# Patient Record
Sex: Female | Born: 1945 | Race: Black or African American | Hispanic: No | Smoking: Current some day smoker
Health system: Southern US, Community
[De-identification: ages and names within clinical notes are randomized; demographics above are authoritative.]

## PROBLEM LIST (undated history)

## (undated) DIAGNOSIS — T883XXA Malignant hyperthermia due to anesthesia, initial encounter: Secondary | ICD-10-CM

## (undated) DIAGNOSIS — IMO0001 Reserved for inherently not codable concepts without codable children: Secondary | ICD-10-CM

## (undated) DIAGNOSIS — I10 Essential (primary) hypertension: Secondary | ICD-10-CM

## (undated) DIAGNOSIS — F0781 Postconcussional syndrome: Secondary | ICD-10-CM

## (undated) DIAGNOSIS — M1990 Unspecified osteoarthritis, unspecified site: Secondary | ICD-10-CM

## (undated) DIAGNOSIS — D649 Anemia, unspecified: Secondary | ICD-10-CM

## (undated) DIAGNOSIS — C679 Malignant neoplasm of bladder, unspecified: Secondary | ICD-10-CM

## (undated) DIAGNOSIS — J449 Chronic obstructive pulmonary disease, unspecified: Secondary | ICD-10-CM

## (undated) DIAGNOSIS — F329 Major depressive disorder, single episode, unspecified: Secondary | ICD-10-CM

## (undated) DIAGNOSIS — F4310 Post-traumatic stress disorder, unspecified: Secondary | ICD-10-CM

## (undated) DIAGNOSIS — E785 Hyperlipidemia, unspecified: Secondary | ICD-10-CM

## (undated) DIAGNOSIS — IMO0002 Reserved for concepts with insufficient information to code with codable children: Secondary | ICD-10-CM

## (undated) DIAGNOSIS — F32A Depression, unspecified: Secondary | ICD-10-CM

## (undated) DIAGNOSIS — M797 Fibromyalgia: Secondary | ICD-10-CM

## (undated) DIAGNOSIS — G44009 Cluster headache syndrome, unspecified, not intractable: Secondary | ICD-10-CM

## (undated) HISTORY — DX: Anemia, unspecified: D64.9

## (undated) HISTORY — DX: Hyperlipidemia, unspecified: E78.5

## (undated) HISTORY — DX: Chronic obstructive pulmonary disease, unspecified: J44.9

## (undated) HISTORY — DX: Fibromyalgia: M79.7

## (undated) HISTORY — PX: HERNIA REPAIR: SHX51

## (undated) HISTORY — PX: CYSTECTOMY: SUR359

## (undated) HISTORY — PX: CYST REMOVAL TRUNK: SHX6283

## (undated) HISTORY — DX: Reserved for concepts with insufficient information to code with codable children: IMO0002

## (undated) HISTORY — DX: Reserved for inherently not codable concepts without codable children: IMO0001

## (undated) HISTORY — DX: Essential (primary) hypertension: I10

## (undated) HISTORY — PX: ABDOMINAL HYSTERECTOMY: SHX81

---

## 1978-06-15 DIAGNOSIS — T883XXA Malignant hyperthermia due to anesthesia, initial encounter: Secondary | ICD-10-CM

## 1978-06-15 HISTORY — PX: DILATION AND CURETTAGE OF UTERUS: SHX78

## 1978-06-15 HISTORY — DX: Malignant hyperthermia due to anesthesia, initial encounter: T88.3XXA

## 2004-06-27 ENCOUNTER — Ambulatory Visit: Payer: Self-pay | Admitting: Family Medicine

## 2004-07-17 ENCOUNTER — Ambulatory Visit: Payer: Self-pay | Admitting: Family Medicine

## 2005-04-29 ENCOUNTER — Ambulatory Visit: Payer: Self-pay | Admitting: Family Medicine

## 2006-06-16 ENCOUNTER — Emergency Department (HOSPITAL_COMMUNITY): Admission: EM | Admit: 2006-06-16 | Discharge: 2006-06-16 | Payer: Self-pay | Admitting: Emergency Medicine

## 2007-08-17 ENCOUNTER — Encounter (INDEPENDENT_AMBULATORY_CARE_PROVIDER_SITE_OTHER): Payer: Self-pay | Admitting: Family Medicine

## 2009-03-21 ENCOUNTER — Encounter: Admission: RE | Admit: 2009-03-21 | Discharge: 2009-03-21 | Payer: Self-pay | Admitting: Internal Medicine

## 2010-06-15 DIAGNOSIS — F0781 Postconcussional syndrome: Secondary | ICD-10-CM

## 2010-06-15 HISTORY — DX: Postconcussional syndrome: F07.81

## 2010-09-22 ENCOUNTER — Emergency Department (HOSPITAL_COMMUNITY)
Admission: EM | Admit: 2010-09-22 | Discharge: 2010-09-23 | Disposition: A | Discharge status: Home / Self Care | Payer: Self-pay | Attending: Emergency Medicine | Admitting: Emergency Medicine

## 2010-09-22 ENCOUNTER — Emergency Department (HOSPITAL_COMMUNITY)
Admission: EM | Admit: 2010-09-22 | Discharge: 2010-09-22 | Discharge status: Against Medical Advice | Payer: Self-pay | Attending: Emergency Medicine | Admitting: Emergency Medicine

## 2010-09-22 DIAGNOSIS — R51 Headache: Secondary | ICD-10-CM | POA: Insufficient documentation

## 2010-09-22 DIAGNOSIS — H538 Other visual disturbances: Secondary | ICD-10-CM | POA: Insufficient documentation

## 2010-09-22 DIAGNOSIS — S0990XA Unspecified injury of head, initial encounter: Secondary | ICD-10-CM | POA: Insufficient documentation

## 2010-09-22 DIAGNOSIS — S060X0A Concussion without loss of consciousness, initial encounter: Secondary | ICD-10-CM | POA: Insufficient documentation

## 2010-09-22 DIAGNOSIS — IMO0002 Reserved for concepts with insufficient information to code with codable children: Secondary | ICD-10-CM | POA: Insufficient documentation

## 2010-09-22 DIAGNOSIS — Y929 Unspecified place or not applicable: Secondary | ICD-10-CM | POA: Insufficient documentation

## 2010-09-22 DIAGNOSIS — R404 Transient alteration of awareness: Secondary | ICD-10-CM | POA: Insufficient documentation

## 2010-09-23 ENCOUNTER — Emergency Department (HOSPITAL_COMMUNITY): Payer: Self-pay

## 2010-12-14 ENCOUNTER — Emergency Department (HOSPITAL_COMMUNITY): Payer: Self-pay

## 2010-12-14 ENCOUNTER — Emergency Department (HOSPITAL_COMMUNITY)
Admission: EM | Admit: 2010-12-14 | Discharge: 2010-12-14 | Disposition: A | Discharge status: Home / Self Care | Payer: Self-pay | Attending: Emergency Medicine | Admitting: Emergency Medicine

## 2010-12-14 DIAGNOSIS — IMO0002 Reserved for concepts with insufficient information to code with codable children: Secondary | ICD-10-CM | POA: Insufficient documentation

## 2010-12-14 DIAGNOSIS — Y92009 Unspecified place in unspecified non-institutional (private) residence as the place of occurrence of the external cause: Secondary | ICD-10-CM | POA: Insufficient documentation

## 2010-12-14 DIAGNOSIS — F0781 Postconcussional syndrome: Secondary | ICD-10-CM | POA: Insufficient documentation

## 2010-12-14 DIAGNOSIS — R51 Headache: Secondary | ICD-10-CM | POA: Insufficient documentation

## 2010-12-14 DIAGNOSIS — S92919A Unspecified fracture of unspecified toe(s), initial encounter for closed fracture: Secondary | ICD-10-CM | POA: Insufficient documentation

## 2011-05-19 ENCOUNTER — Encounter: Payer: Self-pay | Admitting: *Deleted

## 2011-05-19 DIAGNOSIS — R51 Headache: Secondary | ICD-10-CM | POA: Insufficient documentation

## 2011-05-19 DIAGNOSIS — F17200 Nicotine dependence, unspecified, uncomplicated: Secondary | ICD-10-CM | POA: Insufficient documentation
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
## 2011-05-20 ENCOUNTER — Other Ambulatory Visit (HOSPITAL_COMMUNITY): Payer: Self-pay | Admitting: Family Medicine

## 2011-05-20 ENCOUNTER — Emergency Department (HOSPITAL_COMMUNITY)
Admission: EM | Admit: 2011-05-20 | Discharge: 2011-05-20 | Disposition: A | Discharge status: Home / Self Care | Payer: Self-pay | Attending: Emergency Medicine | Admitting: Emergency Medicine

## 2011-05-20 ENCOUNTER — Encounter (HOSPITAL_COMMUNITY): Payer: Self-pay | Admitting: *Deleted

## 2011-05-20 ENCOUNTER — Emergency Department (HOSPITAL_COMMUNITY): Payer: Self-pay

## 2011-05-20 DIAGNOSIS — G44009 Cluster headache syndrome, unspecified, not intractable: Secondary | ICD-10-CM

## 2011-05-20 DIAGNOSIS — R51 Headache: Secondary | ICD-10-CM

## 2011-05-20 HISTORY — DX: Post-traumatic stress disorder, unspecified: F43.10

## 2011-05-20 HISTORY — DX: Cluster headache syndrome, unspecified, not intractable: G44.009

## 2011-05-20 HISTORY — DX: Depression, unspecified: F32.A

## 2011-05-20 HISTORY — DX: Major depressive disorder, single episode, unspecified: F32.9

## 2011-05-20 HISTORY — DX: Postconcussional syndrome: F07.81

## 2011-05-20 NOTE — ED Notes (Signed)
Pt states that she has headaches that start at left temple of head and radiates to side of head and and causes her left eye close shut and waters up. Pt states that she has had migraines in the past and that these headaches are not the same. Pt states that she was supposed to follow up with a Neurologist. Pt states that she does not have insurance and cannot afford to see a Neurologist or PCP. Pt states that she went to see a PCP at [facility] and was diagnosed with cluster headaches and given prednisone tapered dose. States that prednisone helped but headache came back when she ran out of prednisone.

## 2011-05-20 NOTE — ED Notes (Signed)
No complaints at present. Voices understanding of instructions given. Walked to check out window.  

## 2011-05-20 NOTE — ED Notes (Signed)
Patient ambulated to bathroom to collect specimen.

## 2011-05-20 NOTE — ED Notes (Signed)
Pt is requesting to have an MRI done because she has had 5 deaths in her family from cerebral aneuryisms
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
## 2011-05-26 ENCOUNTER — Other Ambulatory Visit (HOSPITAL_COMMUNITY): Payer: Self-pay

## 2011-12-13 ENCOUNTER — Emergency Department (HOSPITAL_COMMUNITY): Payer: No Typology Code available for payment source

## 2011-12-13 ENCOUNTER — Encounter (HOSPITAL_COMMUNITY): Payer: Self-pay | Admitting: Emergency Medicine

## 2011-12-13 ENCOUNTER — Emergency Department (HOSPITAL_COMMUNITY)
Admission: EM | Admit: 2011-12-13 | Discharge: 2011-12-13 | Disposition: A | Discharge status: Home / Self Care | Payer: No Typology Code available for payment source | Attending: Emergency Medicine | Admitting: Emergency Medicine

## 2011-12-13 DIAGNOSIS — F17200 Nicotine dependence, unspecified, uncomplicated: Secondary | ICD-10-CM | POA: Insufficient documentation

## 2011-12-13 DIAGNOSIS — R51 Headache: Secondary | ICD-10-CM | POA: Insufficient documentation

## 2011-12-13 DIAGNOSIS — M542 Cervicalgia: Secondary | ICD-10-CM | POA: Insufficient documentation

## 2011-12-13 DIAGNOSIS — Z79899 Other long term (current) drug therapy: Secondary | ICD-10-CM | POA: Insufficient documentation

## 2011-12-13 MED ORDER — OXYCODONE-ACETAMINOPHEN 5-325 MG PO TABS: 1.0000 | ORAL_TABLET | Freq: Four times a day (QID) | ORAL | Status: AC | PRN

## 2011-12-13 MED ORDER — IBUPROFEN 800 MG PO TABS
800.0000 mg | ORAL_TABLET | Freq: Once | ORAL | Status: AC
  Administered 2011-12-13: 800 mg via ORAL
  Filled 2011-12-13: qty 1
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
## 2011-12-13 NOTE — ED Notes (Signed)
Patient transported to CT 

## 2011-12-13 NOTE — ED Notes (Signed)
Return from CT

## 2011-12-13 NOTE — ED Notes (Signed)
MD at bedside. 
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
## 2012-04-29 ENCOUNTER — Other Ambulatory Visit (HOSPITAL_COMMUNITY): Payer: Self-pay | Admitting: Internal Medicine

## 2012-04-29 DIAGNOSIS — F17200 Nicotine dependence, unspecified, uncomplicated: Secondary | ICD-10-CM

## 2012-04-29 DIAGNOSIS — Z1231 Encounter for screening mammogram for malignant neoplasm of breast: Secondary | ICD-10-CM

## 2012-05-23 ENCOUNTER — Ambulatory Visit (HOSPITAL_COMMUNITY)
Admission: RE | Admit: 2012-05-23 | Discharge: 2012-05-23 | Disposition: A | Discharge status: Home / Self Care | Payer: Medicare Other | Source: Ambulatory Visit | Attending: Internal Medicine | Admitting: Internal Medicine

## 2012-05-23 DIAGNOSIS — I10 Essential (primary) hypertension: Secondary | ICD-10-CM | POA: Insufficient documentation

## 2012-05-23 DIAGNOSIS — Z0000 Encounter for general adult medical examination without abnormal findings: Secondary | ICD-10-CM | POA: Insufficient documentation

## 2012-05-23 DIAGNOSIS — F17200 Nicotine dependence, unspecified, uncomplicated: Secondary | ICD-10-CM | POA: Insufficient documentation

## 2012-05-23 DIAGNOSIS — Z1231 Encounter for screening mammogram for malignant neoplasm of breast: Secondary | ICD-10-CM | POA: Insufficient documentation

## 2012-09-14 ENCOUNTER — Other Ambulatory Visit: Payer: Self-pay | Admitting: Internal Medicine

## 2012-09-14 DIAGNOSIS — M5410 Radiculopathy, site unspecified: Secondary | ICD-10-CM

## 2012-09-17 ENCOUNTER — Other Ambulatory Visit: Payer: Medicare Other

## 2012-09-19 ENCOUNTER — Other Ambulatory Visit: Payer: Medicare Other

## 2012-09-23 ENCOUNTER — Ambulatory Visit
Admission: RE | Admit: 2012-09-23 | Discharge: 2012-09-23 | Disposition: A | Discharge status: Home / Self Care | Payer: Medicare Other | Source: Ambulatory Visit | Attending: Internal Medicine | Admitting: Internal Medicine

## 2012-09-23 DIAGNOSIS — M5410 Radiculopathy, site unspecified: Secondary | ICD-10-CM

## 2013-06-16 ENCOUNTER — Ambulatory Visit (INDEPENDENT_AMBULATORY_CARE_PROVIDER_SITE_OTHER): Payer: Medicare Other | Admitting: Physician Assistant

## 2013-06-16 ENCOUNTER — Encounter: Payer: Self-pay | Admitting: Physician Assistant

## 2013-06-16 VITALS — BP 122/72 | HR 80 | Temp 98.2°F | Resp 16 | Ht 68.0 in | Wt 133.0 lb

## 2013-06-16 DIAGNOSIS — J019 Acute sinusitis, unspecified: Secondary | ICD-10-CM

## 2013-06-16 MED ORDER — AZITHROMYCIN 250 MG PO TABS
ORAL_TABLET | ORAL | Status: DC
Start: 2013-06-16 — End: 2013-07-11

## 2013-06-16 MED ORDER — PREDNISONE 20 MG PO TABS
ORAL_TABLET | ORAL | Status: DC
Start: 2013-06-16 — End: 2013-07-11

## 2013-06-16 NOTE — Patient Instructions (Addendum)
The majority of colds are caused by viruses and do not require antibiotics. Please read the rest of this hand out to learn more about the common cold and what you can do to help yourself as well as help prevent the over use of antibiotics.   COMMON COLD SIGNS AND SYMPTOMS - The common cold usually causes nasal congestion, runny nose, and sneezing. A sore throat may be present on the first day but usually resolves quickly. If a cough occurs, it generally develops on about the fourth or fifth day of symptoms, typically when congestion and runny nose are resolving  COMMON COLD COMPLICATIONS - In most cases, colds do not cause serious illness or complications. Most colds last for three to seven days, although many people continue to have symptoms (coughing, sneezing, congestion) for up to two weeks.  One of the more common complications is sinusitis, which is usually caused by viruses and rarely (about 2 percent of the time) by bacteria. Having thick or yellow to green-colored nasal discharge does not mean that bacterial sinusitis has developed; discolored nasal discharge is a normal phase of the common cold.  Lower respiratory infections, such as pneumonia or bronchitis, may develop following a cold.  Infection of the middle ear, or otitis media, can accompany or follow a cold.  COMMON COLD TREATMENT - There is no specific treatment for the viruses that cause the common cold. Most treatments are aimed at relieving some of the symptoms of the cold, but do not shorten or cure the cold. Antibiotics are not useful for treating the common cold; antibiotics are only used to treat illnesses caused by bacteria, not viruses. Unnecessary use of antibiotics for the treatment of the common cold can cause allergic reactions, diarrhea, or other gastrointestinal symptoms in some patients.  The symptoms of a cold will resolve over time, even without any treatment. People with underlying medical conditions and those who use  other over-the-counter or prescription medications should speak with their healthcare provider or pharmacist to ensure that it is safe to use these treatments. The following are treatments that may reduce the symptoms caused by the common cold.  Nasal congestion - Decongestants are good for nasal congestion- if you feel very stuffy but no mucus is coming out, this is the medication that will help you the most.  Pseudoephedrine is a decongestant that can improve nasal congestion. Although a prescription is not required, drugstores in the [place] keep pseudoephedrine behind the counter, so it must be requested from a pharmacist. If you have a heart condition or high blood pressure please use Coricidin BPH instead.   Runny nose - Antihistamines such as diphenhydramine (Benadryl), certazine (Zyrtec) which are best taking at night because they can make you tired OR loratadine (Claritin),  fexafinadine (Allegra) help with a runny nose.   Nasal sprays such an oxymetazoline (Afrin and others) may also give temporary relief of nasal congestion. However, these sprays should never be used for more than two to three days; use for more than three days use can worsen congestion.  Nasocort is now over the counter and can help decrease a runny nose. Please stop the medication if you have blurry vision or nose bleeds.   Sore throat and headache - Sore throat and headache are best treated with a mild pain reliever such as acetaminophen (Tylenol) or a non-steroidal anti-inflammatory agent such as ibuprofen or naproxen (Motrin or Aleve). These medications should be taken with food to prevent stomach problems. As well as   gargling with warm water and salt.   Cough - Common cough medicine ingredients include guaifenesin and dextromethorphan; these are often combined with other medications in over-the-counter cold formulas. Often a cough is worse at night or first in the morning due to post nasal drip from you nose. You can  try to sleep at an angle to decrease a cough.   Alternative treatments - Heated, humidified air can improve symptoms of nasal congestion and runny nose, and causes few to no side effects. A number of alternative products, including vitamin C, doubling up on your vitamin D and herbal products such as echinacea, may help. Certain products, such as nasal gels that contain zinc (eg, Zicam), have been associated with a permanent loss of smell.  Antibiotics - Antibiotics should not be used to treat an uncomplicated common cold. As noted above, colds are caused by viruses. Antibiotics treat bacterial, not viral infections. Some viruses that cause the common cold can also depress the immune system or cause swelling in the lining of the nose or airways; this can, in turn, lead to a bacterial infection. Often you need to give your body 7 days to fight off a common cold while treating the symptoms with the medications listed above. If after 7 days your symptoms are not improving, you are getting worse, you have shortness of breath, chest pain, a fever of over 103 you should seek medical help immediately.   PREVENTION IS THE BEST MEDICINE - Hand washing is an essential and highly effective way to prevent the spread of infection.  Alcohol-based hand rubs are a good alternative for disinfecting hands if a sink is not available.  Hands should be washed before preparing food and eating and after coughing, blowing the nose, or sneezing. While it is not always possible to limit contact with people who may be infected with a cold, touching the eyes, nose, or mouth after direct contact should be avoided when possible. Sneezing/coughing into the sleeve of one's clothing (at the inner elbow) is another means of containing sprays of saliva and secretions and does not contaminate the hands.  Take the prednsone 20mg  once daily in the morning with food for 2-3 days and then if you are not feeling better take the Zpak given.

## 2013-06-16 NOTE — Progress Notes (Signed)
   Subjective:    Patient ID: Sonya Banks, female    DOB: 04-Mar-1946, 68 y.o.   MRN: [id number]  Sinus Problem This is a new problem. Episode onset: 4 days. The problem has been gradually worsening since onset. There has been no fever. She is experiencing no pain. Associated symptoms include chills, congestion, coughing, headaches, sinus pressure, sneezing and a sore throat. Pertinent negatives include no diaphoresis, ear pain, hoarse voice, neck pain, shortness of breath or swollen glands. Past treatments include acetaminophen and oral decongestants. The treatment provided no relief.   Review of Systems  Constitutional: Positive for chills. Negative for fever and diaphoresis.  HENT: Positive for congestion, sinus pressure, sneezing and sore throat. Negative for ear pain, hoarse voice, tinnitus, trouble swallowing and voice change.   Eyes: Negative.   Respiratory: Positive for cough. Negative for chest tightness, shortness of breath and wheezing.   Cardiovascular: Negative.   Gastrointestinal: Negative.   Endocrine: Negative.   Genitourinary: Negative.   Musculoskeletal: Negative.  Negative for neck pain.  Neurological: Positive for headaches. Negative for dizziness, light-headedness and numbness.      Objective:   Physical Exam  Constitutional: She appears well-developed and well-nourished.  HENT:  Head: Normocephalic and atraumatic.  Right Ear: External ear normal.  Nose: Right sinus exhibits frontal sinus tenderness. Left sinus exhibits frontal sinus tenderness.  Eyes: Conjunctivae and EOM are normal.  Neck: Normal range of motion. Neck supple.  Cardiovascular: Normal rate, regular rhythm, normal heart sounds and intact distal pulses.   Pulmonary/Chest: Effort normal and breath sounds normal. No respiratory distress. She has no wheezes.  Abdominal: Soft. Bowel sounds are normal.  Lymphadenopathy:    She has no cervical adenopathy.  Skin: Skin is warm and dry.       Assessment & Plan:  Acute sinusitis, unspecified - Plan: azithromycin (ZITHROMAX) 250 MG tablet, predniSONE (DELTASONE) 20 MG tablet  Hold onto Zpak and if she is not feeling better in 5 days then get on it.

## 2013-07-10 DIAGNOSIS — I10 Essential (primary) hypertension: Secondary | ICD-10-CM | POA: Insufficient documentation

## 2013-07-10 DIAGNOSIS — D649 Anemia, unspecified: Secondary | ICD-10-CM | POA: Insufficient documentation

## 2013-07-10 DIAGNOSIS — G441 Vascular headache, not elsewhere classified: Secondary | ICD-10-CM | POA: Insufficient documentation

## 2013-07-10 DIAGNOSIS — M797 Fibromyalgia: Secondary | ICD-10-CM | POA: Insufficient documentation

## 2013-07-10 DIAGNOSIS — E785 Hyperlipidemia, unspecified: Secondary | ICD-10-CM | POA: Insufficient documentation

## 2013-07-10 DIAGNOSIS — F325 Major depressive disorder, single episode, in full remission: Secondary | ICD-10-CM | POA: Insufficient documentation

## 2013-07-10 DIAGNOSIS — J449 Chronic obstructive pulmonary disease, unspecified: Secondary | ICD-10-CM | POA: Insufficient documentation

## 2013-07-11 ENCOUNTER — Encounter: Payer: Self-pay | Admitting: Internal Medicine

## 2013-07-11 ENCOUNTER — Ambulatory Visit (INDEPENDENT_AMBULATORY_CARE_PROVIDER_SITE_OTHER): Payer: Medicare Other | Admitting: Internal Medicine

## 2013-07-11 VITALS — BP 124/72 | HR 68 | Temp 97.9°F | Resp 16 | Ht 68.0 in | Wt 130.8 lb

## 2013-07-11 DIAGNOSIS — E785 Hyperlipidemia, unspecified: Secondary | ICD-10-CM

## 2013-07-11 DIAGNOSIS — R7303 Prediabetes: Secondary | ICD-10-CM | POA: Insufficient documentation

## 2013-07-11 DIAGNOSIS — Z0000 Encounter for general adult medical examination without abnormal findings: Secondary | ICD-10-CM

## 2013-07-11 DIAGNOSIS — E559 Vitamin D deficiency, unspecified: Secondary | ICD-10-CM

## 2013-07-11 DIAGNOSIS — N6019 Diffuse cystic mastopathy of unspecified breast: Secondary | ICD-10-CM

## 2013-07-11 DIAGNOSIS — Z1212 Encounter for screening for malignant neoplasm of rectum: Secondary | ICD-10-CM

## 2013-07-11 DIAGNOSIS — R7309 Other abnormal glucose: Secondary | ICD-10-CM

## 2013-07-11 DIAGNOSIS — Z79899 Other long term (current) drug therapy: Secondary | ICD-10-CM

## 2013-07-11 DIAGNOSIS — I10 Essential (primary) hypertension: Secondary | ICD-10-CM

## 2013-07-11 NOTE — Patient Instructions (Signed)
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
## 2013-07-12 LAB — CBC WITH DIFFERENTIAL/PLATELET
BASOS ABS: 0 10*3/uL (ref 0.0–0.1)
Basophils Relative: 0 % (ref 0–1)
Eosinophils Absolute: 0.1 10*3/uL (ref 0.0–0.7)
Eosinophils Relative: 2 % (ref 0–5)
HEMATOCRIT: 39.2 % (ref 36.0–46.0)
HEMOGLOBIN: 13 g/dL (ref 12.0–15.0)
LYMPHS PCT: 48 % — AB (ref 12–46)
Lymphs Abs: 2.3 10*3/uL (ref 0.7–4.0)
MCH: 29.6 pg (ref 26.0–34.0)
MCHC: 33.2 g/dL (ref 30.0–36.0)
MCV: 89.3 fL (ref 78.0–100.0)
MONO ABS: 0.4 10*3/uL (ref 0.1–1.0)
Monocytes Relative: 9 % (ref 3–12)
NEUTROS ABS: 2 10*3/uL (ref 1.7–7.7)
NEUTROS PCT: 41 % — AB (ref 43–77)
PLATELETS: 278 10*3/uL (ref 150–400)
RBC: 4.39 MIL/uL (ref 3.87–5.11)
RDW: 12.8 % (ref 11.5–15.5)
WBC: 4.8 10*3/uL (ref 4.0–10.5)

## 2013-07-12 LAB — HEPATIC FUNCTION PANEL
ALK PHOS: 84 U/L (ref 39–117)
ALT: 11 U/L (ref 0–35)
AST: 21 U/L (ref 0–37)
Albumin: 4 g/dL (ref 3.5–5.2)
BILIRUBIN DIRECT: 0.1 mg/dL (ref 0.0–0.3)
BILIRUBIN INDIRECT: 0.3 mg/dL (ref 0.0–0.9)
BILIRUBIN TOTAL: 0.4 mg/dL (ref 0.3–1.2)
Total Protein: 6.9 g/dL (ref 6.0–8.3)

## 2013-07-12 LAB — TSH: TSH: 1.167 u[IU]/mL (ref 0.350–4.500)

## 2013-07-12 LAB — INSULIN, FASTING: Insulin fasting, serum: 28 u[IU]/mL (ref 3–28)

## 2013-07-12 LAB — LIPID PANEL
CHOLESTEROL: 173 mg/dL (ref 0–200)
HDL: 46 mg/dL (ref >39)
LDL Cholesterol: 112 mg/dL — ABNORMAL HIGH (ref 0–99)
TRIGLYCERIDES: 77 mg/dL (ref <150)
Total CHOL/HDL Ratio: 3.8 Ratio
VLDL: 15 mg/dL (ref 0–40)

## 2013-07-12 LAB — BASIC METABOLIC PANEL WITH GFR
BUN: 11 mg/dL (ref 6–23)
CO2: 30 mEq/L (ref 19–32)
Calcium: 10.1 mg/dL (ref 8.4–10.5)
Chloride: 108 mEq/L (ref 96–112)
Creat: 0.77 mg/dL (ref 0.50–1.10)
GFR, Est Non African American: 81 mL/min
GLUCOSE: 89 mg/dL (ref 70–99)
POTASSIUM: 5.8 meq/L — AB (ref 3.5–5.3)
Sodium: 142 mEq/L (ref 135–145)

## 2013-07-12 LAB — URINALYSIS, MICROSCOPIC ONLY
BACTERIA UA: NONE SEEN
Casts: NONE SEEN
Crystals: NONE SEEN
Squamous Epithelial / LPF: NONE SEEN

## 2013-07-12 LAB — MICROALBUMIN / CREATININE URINE RATIO
CREATININE, URINE: 100.5 mg/dL
Microalb Creat Ratio: 54.2 mg/g — ABNORMAL HIGH (ref 0.0–30.0)
Microalb, Ur: 5.45 mg/dL — ABNORMAL HIGH (ref 0.00–1.89)

## 2013-07-12 LAB — HEMOGLOBIN A1C
Hgb A1c MFr Bld: 5.6 % (ref <5.7)
MEAN PLASMA GLUCOSE: 114 mg/dL (ref <117)

## 2013-07-12 LAB — VITAMIN D 25 HYDROXY (VIT D DEFICIENCY, FRACTURES): Vit D, 25-Hydroxy: 52 ng/mL (ref 30–89)

## 2013-07-12 LAB — MAGNESIUM: MAGNESIUM: 2 mg/dL (ref 1.5–2.5)

## 2013-07-17 ENCOUNTER — Other Ambulatory Visit: Payer: Self-pay | Admitting: Internal Medicine

## 2013-07-17 ENCOUNTER — Telehealth: Payer: Self-pay | Admitting: *Deleted

## 2013-07-17 MED ORDER — MELOXICAM 7.5 MG PO TABS: 7.5000 mg | ORAL_TABLET | Freq: Every day | ORAL | Status: DC

## 2013-07-18 ENCOUNTER — Other Ambulatory Visit: Payer: Self-pay | Admitting: Internal Medicine

## 2013-07-18 ENCOUNTER — Other Ambulatory Visit: Payer: Medicare Other

## 2013-07-18 DIAGNOSIS — N3000 Acute cystitis without hematuria: Secondary | ICD-10-CM

## 2013-07-19 LAB — URINE CULTURE
COLONY COUNT: NO GROWTH
ORGANISM ID, BACTERIA: NO GROWTH

## 2013-07-19 NOTE — Telephone Encounter (Signed)
Spoke with patient about labs 

## 2013-07-28 ENCOUNTER — Other Ambulatory Visit: Payer: Self-pay | Admitting: Internal Medicine

## 2013-07-28 MED ORDER — FLUCONAZOLE 150 MG PO TABS: ORAL_TABLET | ORAL | Status: AC

## 2013-08-01 ENCOUNTER — Other Ambulatory Visit: Payer: Self-pay | Admitting: Internal Medicine

## 2013-08-01 DIAGNOSIS — Z1231 Encounter for screening mammogram for malignant neoplasm of breast: Secondary | ICD-10-CM

## 2013-08-03 ENCOUNTER — Other Ambulatory Visit (INDEPENDENT_AMBULATORY_CARE_PROVIDER_SITE_OTHER): Payer: Medicare Other

## 2013-08-03 DIAGNOSIS — Z1212 Encounter for screening for malignant neoplasm of rectum: Secondary | ICD-10-CM

## 2013-08-03 LAB — POC HEMOCCULT BLD/STL (HOME/3-CARD/SCREEN)
Card #3 Fecal Occult Blood, POC: NEGATIVE
FECAL OCCULT BLD: NEGATIVE
FECAL OCCULT BLD: NEGATIVE

## 2013-08-09 ENCOUNTER — Ambulatory Visit (HOSPITAL_COMMUNITY)
Admission: RE | Admit: 2013-08-09 | Discharge: 2013-08-09 | Disposition: A | Discharge status: Home / Self Care | Payer: Medicare Other | Source: Ambulatory Visit | Attending: Internal Medicine | Admitting: Internal Medicine

## 2013-08-09 DIAGNOSIS — Z1231 Encounter for screening mammogram for malignant neoplasm of breast: Secondary | ICD-10-CM

## 2013-10-24 ENCOUNTER — Other Ambulatory Visit: Payer: Self-pay | Admitting: *Deleted

## 2013-10-24 DIAGNOSIS — R319 Hematuria, unspecified: Secondary | ICD-10-CM

## 2013-10-31 ENCOUNTER — Telehealth: Payer: Self-pay

## 2013-10-31 NOTE — Telephone Encounter (Signed)
Pt called to check status of urology appt. I called [facility] to see if pt had been scheduled. Pt is scheduled 11-09-13 @ 1:45 w/ Dr.Eskridge. Pt is extremely upset because she has waited 1 week for appt and is requesting to speak w/ Dr.McKeown. Please call pt.

## 2013-11-13 DIAGNOSIS — C679 Malignant neoplasm of bladder, unspecified: Secondary | ICD-10-CM

## 2013-11-13 HISTORY — DX: Malignant neoplasm of bladder, unspecified: C67.9

## 2013-11-20 ENCOUNTER — Other Ambulatory Visit: Payer: Self-pay | Admitting: Urology

## 2013-11-21 ENCOUNTER — Ambulatory Visit (INDEPENDENT_AMBULATORY_CARE_PROVIDER_SITE_OTHER): Payer: Medicare Other | Admitting: Internal Medicine

## 2013-11-21 ENCOUNTER — Encounter: Payer: Self-pay | Admitting: Internal Medicine

## 2013-11-21 VITALS — BP 100/64 | HR 84 | Temp 98.2°F | Resp 16 | Ht 68.0 in | Wt 131.2 lb

## 2013-11-21 DIAGNOSIS — F17200 Nicotine dependence, unspecified, uncomplicated: Secondary | ICD-10-CM

## 2013-11-21 DIAGNOSIS — F341 Dysthymic disorder: Secondary | ICD-10-CM

## 2013-11-21 DIAGNOSIS — I10 Essential (primary) hypertension: Secondary | ICD-10-CM

## 2013-11-21 DIAGNOSIS — C679 Malignant neoplasm of bladder, unspecified: Secondary | ICD-10-CM

## 2013-11-21 MED ORDER — BUPROPION HCL ER (XL) 150 MG PO TB24: 150.0000 mg | ORAL_TABLET | ORAL | Status: DC

## 2013-11-21 MED ORDER — LORAZEPAM 2 MG PO TABS: ORAL_TABLET | ORAL | Status: DC

## 2013-11-21 MED ORDER — BUPROPION HCL ER (XL) 300 MG PO TB24: 300.0000 mg | ORAL_TABLET | ORAL | Status: DC

## 2013-11-21 NOTE — Patient Instructions (Signed)
Smoking Cessation, Tips for Success If you are ready to quit smoking, congratulations! You have chosen to help yourself be healthier. Cigarettes bring nicotine, tar, carbon monoxide, and other irritants into your body. Your lungs, heart, and blood vessels will be able to work better without these poisons. There are many different ways to quit smoking. Nicotine gum, nicotine patches, a nicotine inhaler, or nicotine nasal spray can help with physical craving. Hypnosis, support groups, and medicines help break the habit of smoking. WHAT THINGS CAN I DO TO MAKE QUITTING EASIER?  Here are some tips to help you quit for good:  Pick a date when you will quit smoking completely. Tell all of your friends and family about your plan to quit on that date.  Do not try to slowly cut down on the number of cigarettes you are smoking. Pick a quit date and quit smoking completely starting on that day.  Throw away all cigarettes.   Clean and remove all ashtrays from your home, work, and car.   On a card, write down your reasons for quitting. Carry the card with you and read it when you get the urge to smoke.   Cleanse your body of nicotine. Drink enough water and fluids to keep your urine clear or pale yellow. Do this after quitting to flush the nicotine from your body.   Learn to predict your moods. Do not let a bad situation be your excuse to have a cigarette. Some situations in your life might tempt you into wanting a cigarette.   Never have "just one" cigarette. It leads to wanting another and another. Remind yourself of your decision to quit.   Change habits associated with smoking. If you smoked while driving or when feeling stressed, try other activities to replace smoking. Stand up when drinking your coffee. Brush your teeth after eating. Sit in a different chair when you read the paper. Avoid alcohol while trying to quit, and try to drink fewer caffeinated beverages. Alcohol and caffeine may urge  you to smoke.   Avoid foods and drinks that can trigger a desire to smoke, such as sugary or spicy foods and alcohol.   Ask people who smoke not to smoke around you.   Have something planned to do right after eating or having a cup of coffee. For example, plan to take a walk or exercise.   Try a relaxation exercise to calm you down and decrease your stress. Remember, you may be tense and nervous for the first 2 weeks after you quit, but this will pass.   Find new activities to keep your hands busy. Play with a pen, coin, or rubber band. Doodle or draw things on paper.   Brush your teeth right after eating. This will help cut down on the craving for the taste of tobacco after meals. You can also try mouthwash.   Use oral substitutes in place of cigarettes. Try using lemon drops, carrots, cinnamon sticks, or chewing gum. Keep them handy so they are available when you have the urge to smoke.   When you have the urge to smoke, try deep breathing.   Designate your home as a nonsmoking area.   If you are a heavy smoker, ask your health care provider about a prescription for nicotine chewing gum. It can ease your withdrawal from nicotine.   Reward yourself. Set aside the cigarette money you save and buy yourself something nice.   Look for support from others. Join a support group or  smoking cessation program. Ask someone at home or at work to help you with your plan to quit smoking.   Always ask yourself, "Do I need this cigarette or is this just a reflex?" Tell yourself, "Today, I choose not to smoke," or "I do not want to smoke." You are reminding yourself of your decision to quit.  Do not replace cigarette smoking with electronic cigarettes (commonly called e-cigarettes). The safety of e-cigarettes is unknown, and some may contain harmful chemicals.  If you relapse, do not give up! Plan ahead and think about what you will do the next time you get the urge to smoke.  HOW WILL  I FEEL WHEN I QUIT SMOKING? You may have symptoms of withdrawal because your body is used to nicotine (the addictive substance in cigarettes). You may crave cigarettes, be irritable, feel very hungry, cough often, get headaches, or have difficulty concentrating. The withdrawal symptoms are only temporary. They are strongest when you first quit but will go away within 10 14 days. When withdrawal symptoms occur, stay in control. Think about your reasons for quitting. Remind yourself that these are signs that your body is healing and getting used to being without cigarettes. Remember that withdrawal symptoms are easier to treat than the major diseases that smoking can cause.  Even after the withdrawal is over, expect periodic urges to smoke. However, these cravings are generally short lived and will go away whether you smoke or not. Do not smoke!  WHAT RESOURCES ARE AVAILABLE TO HELP ME QUIT SMOKING? Your health care provider can direct you to community resources or hospitals for support, which may include:  Group support.  Education.  Hypnosis.  Therapy. Document Released: 02/28/2004 Document Revised: 03/22/2013 Document Reviewed: 11/17/2012 [facility] Patient Information 2014 [place], [place].    Smoking Cessation Quitting smoking is important to your health and has many advantages. However, it is not always easy to quit since nicotine is a very addictive drug. Often times, people try 3 times or more before being able to quit. This document explains the best ways for you to prepare to quit smoking. Quitting takes hard work and a lot of effort, but you can do it. ADVANTAGES OF QUITTING SMOKING  You will live longer, feel better, and live better.  Your body will feel the impact of quitting smoking almost immediately.  Within 20 minutes, blood pressure decreases. Your pulse returns to its normal level.  After 8 hours, carbon monoxide levels in the blood return to normal. Your oxygen level  increases.  After 24 hours, the chance of having a heart attack starts to decrease. Your breath, hair, and body stop smelling like smoke.  After 48 hours, damaged nerve endings begin to recover. Your sense of taste and smell improve.  After 72 hours, the body is virtually free of nicotine. Your bronchial tubes relax and breathing becomes easier.  After 2 to 12 weeks, lungs can hold more air. Exercise becomes easier and circulation improves.  The risk of having a heart attack, stroke, cancer, or lung disease is greatly reduced.  After 1 year, the risk of coronary heart disease is cut in half.  After 5 years, the risk of stroke falls to the same as a nonsmoker.  After 10 years, the risk of lung cancer is cut in half and the risk of other cancers decreases significantly.  After 15 years, the risk of coronary heart disease drops, usually to the level of a nonsmoker.  If you are pregnant, quitting smoking  will improve your chances of having a healthy baby.  The people you live with, especially any children, will be healthier.  You will have extra money to spend on things other than cigarettes. QUESTIONS TO THINK ABOUT BEFORE ATTEMPTING TO QUIT You may want to talk about your answers with your caregiver.  Why do you want to quit?  If you tried to quit in the past, what helped and what did not?  What will be the most difficult situations for you after you quit? How will you plan to handle them?  Who can help you through the tough times? Your family? Friends? A caregiver?  What pleasures do you get from smoking? What ways can you still get pleasure if you quit? Here are some questions to ask your caregiver:  How can you help me to be successful at quitting?  What medicine do you think would be best for me and how should I take it?  What should I do if I need more help?  What is smoking withdrawal like? How can I get information on withdrawal? GET READY  Set a quit  date.  Change your environment by getting rid of all cigarettes, ashtrays, matches, and lighters in your home, car, or work. Do not let people smoke in your home.  Review your past attempts to quit. Think about what worked and what did not. GET SUPPORT AND ENCOURAGEMENT You have a better chance of being successful if you have help. You can get support in many ways.  Tell your family, friends, and co-workers that you are going to quit and need their support. Ask them not to smoke around you.  Get individual, group, or telephone counseling and support. Programs are available at [employer] and health centers. Call your local health department for information about programs in your area.  Spiritual beliefs and practices may help some smokers quit.  Download a "quit meter" on your computer to keep track of quit statistics, such as how long you have gone without smoking, cigarettes not smoked, and money saved.  Get a self-help book about quitting smoking and staying off of tobacco. [place] yourself from urges to smoke. Talk to someone, go for a walk, or occupy your time with a task.  Change your normal routine. Take a different route to work. Drink tea instead of coffee. Eat breakfast in a different place.  Reduce your stress. Take a hot bath, exercise, or read a book.  Plan something enjoyable to do every day. Reward yourself for not smoking.  Explore interactive web-based programs that specialize in helping you quit. GET MEDICINE AND USE IT CORRECTLY Medicines can help you stop smoking and decrease the urge to smoke. Combining medicine with the above behavioral methods and support can greatly increase your chances of successfully quitting smoking.  Nicotine replacement therapy helps deliver nicotine to your body without the negative effects and risks of smoking. Nicotine replacement therapy includes nicotine gum, lozenges, inhalers, nasal sprays, and  skin patches. Some may be available over-the-counter and others require a prescription.  Antidepressant medicine helps people abstain from smoking, but how this works is unknown. This medicine is available by prescription.  Nicotinic receptor partial agonist medicine simulates the effect of nicotine in your brain. This medicine is available by prescription. Ask your caregiver for advice about which medicines to use and how to use them based on your health history. Your caregiver will tell you what side effects to look out  for if you choose to be on a medicine or therapy. Carefully read the information on the package. Do not use any other product containing nicotine while using a nicotine replacement product.  RELAPSE OR DIFFICULT SITUATIONS Most relapses occur within the first 3 months after quitting. Do not be discouraged if you start smoking again. Remember, most people try several times before finally quitting. You may have symptoms of withdrawal because your body is used to nicotine. You may crave cigarettes, be irritable, feel very hungry, cough often, get headaches, or have difficulty concentrating. The withdrawal symptoms are only temporary. They are strongest when you first quit, but they will go away within 10 14 days. To reduce the chances of relapse, try to:  Avoid drinking alcohol. Drinking lowers your chances of successfully quitting.  Reduce the amount of caffeine you consume. Once you quit smoking, the amount of caffeine in your body increases and can give you symptoms, such as a rapid heartbeat, sweating, and anxiety.  Avoid smokers because they can make you want to smoke.  Do not let weight gain distract you. Many smokers will gain weight when they quit, usually less than 10 pounds. Eat a healthy diet and stay active. You can always lose the weight gained after you quit.  Find ways to improve your mood other than smoking. FOR MORE INFORMATION  www.smokefree.gov  Document  Released: 05/26/2001 Document Revised: 12/01/2011 Document Reviewed: 09/10/2011 [facility] Patient Information 2014 [place], [place].

## 2013-11-21 NOTE — Progress Notes (Signed)
Subjective:    Patient ID: Sonya Banks, female    DOB: 26-May-1946, 68 y.o.   MRN: [id number]  HPI Very nice 68 yo SBF presenting with questions re: recent Cystoscopy and Dx of bladder cancer and plans for surgical resection by Dr Junious Silk.    Patient was also advised of tobacco smoking as etiology of bladder as well as many other kinds of cancers. Techniques of smoking cessations were discussed. Also, patient admitted her addiction to nicotine products and failure with Chantix in the remote past.   Current Outpatient Prescriptions on File Prior to Visit  Medication Sig Dispense Refill  . aspirin 81 MG tablet Take 81 mg by mouth daily.      . Cholecalciferol (VITAMIN D PO) Take 5,000 Units by mouth daily.      . cyclobenzaprine (FLEXERIL) 10 MG tablet Take 10 mg by mouth 3 (three) times daily as needed for muscle spasms. Takes 1/2 to 1 prn      . FLUoxetine (PROZAC) 20 MG capsule Take 20 mg by mouth daily.      . Magnesium 250 MG TABS Take by mouth daily.      . meloxicam (MOBIC) 7.5 MG tablet Take 1 tablet (7.5 mg total) by mouth daily.  90 tablet  1  . Multiple Vitamins-Minerals (SENIOR MULTIVITAMIN PLUS PO) Take by mouth daily.       No current facility-administered medications on file prior to visit.   Allergies  Allergen Reactions  . Bee Venom Anaphylaxis  . Sulfa Antibiotics Swelling     Starts Internally, hard to breath , with large patches of hives.  . Ibuprofen Other (See Comments)    Patient's family has a history of kidney problems and MD stated not to take Ibuprofen.   Past Medical History  Diagnosis Date  . Concussion syndrome   . Migraine   . Cluster headache   . PTSD (post-traumatic stress disorder)   . Hypertension   . COPD (chronic obstructive pulmonary disease)   . Hyperlipidemia   . Fibromyalgia   . Anemia   . Depression    Past Surgical History  Procedure Laterality Date  . Cystectomy    . Hernia repair     Review of Systems In addition to  the HPI above,  No Fever-chills,  No Headache, No changes with Vision or hearing,  No problems swallowing food or Liquids,  No Chest pain or productive Cough or Shortness of Breath,  No Abdominal pain, No Nausea or Vommitting, Bowel movements are regular,  No Blood in stool or Urine,  No dysuria,  No new skin rashes or bruises,  No new joints pains-aches,  No new weakness, tingling, numbness in any extremity,  No recent weight loss,  No polyuria, polydypsia or polyphagia,  No significant Mental Stressors.  A full 10 point Review of Systems was done, except as stated above, all other Review of Systems were negative  Objective:   Physical Exam  BP 100/64  Pulse 84  Temp 98.2 F   Resp 16  Ht 5\' 8"    Wt 131 lb 3.2 oz   BMI 19.95 kg/m2  HEENT - Eac's patent. TM's Nl.EOM's full. PERRLA. NasoOroPharynx clear. Neck - supple. Nl Thyroid. No bruits nodes JVD Chest - Clear equal BS Cor - Nl HS. RRR w/o sig MGR. PP 1(+) No edema. Abd - No palpable organomegaly, masses or tenderness. BS nl. MS- FROM. w/o deformities. Muscle power tone and bulk Nl. Gait Nl. Neuro - No obvious  Cr N abnormalities.  Sensory, motor and Cerebellar functions appear Nl w/o focal abnormalities. Psyche- somewhat anxious over her recent Dx of Cancer,.  Assessment & Plan:   1. Hypertension- controlled  2. Dysthymic disorder  3. Bladder cancer - new DX  4. Nicotine addiction- Rx wellbutrin XL 150 mg x 1 mo  increasings to 300 mg qd along with her current Prozac dose.  F/U ~ 2 mo

## 2013-11-22 ENCOUNTER — Encounter (HOSPITAL_COMMUNITY): Payer: Self-pay | Admitting: Pharmacy Technician

## 2013-11-23 ENCOUNTER — Other Ambulatory Visit (HOSPITAL_COMMUNITY): Payer: Self-pay | Admitting: Anesthesiology

## 2013-11-23 NOTE — Progress Notes (Signed)
ekg 07-11-13 epic

## 2013-11-23 NOTE — Patient Instructions (Addendum)
[place]  11/23/2013   Your procedure is scheduled on: Tuesday June 23rd, 2015  Report to [facility] Main Entrance and follow signs to  [place] at 1030 AM.  Call this number if you have problems the morning of surgery [phone]   Remember:  Do not eat food or drink liquids :After Midnight.     Take these medicines the morning of surgery with A SIP OF WATER: Prozac, Wellbutrin XL                               You may not have any metal on your body including hair pins and piercings  Do not wear jewelry, make-up, lotions, powders, or deodorant.   Men may shave face and neck.  Do not bring valuables to the hospital. [place].  Contacts, dentures or bridgework may not be worn into surgery.  Leave suitcase in the car. After surgery it may be brought to your room.  For patients admitted to the hospital, checkout time is 11:00 AM the day of discharge.   Patients discharged the day of surgery will not be allowed to drive home.  Name and phone number of your driver:  Special Instructions: N/A ________________________________________________________________________  [facility] - Preparing for Surgery Before surgery, you can play an important role.  Because skin is not sterile, your skin needs to be as free of germs as possible.  You can reduce the number of germs on your skin by washing with CHG (chlorahexidine gluconate) soap before surgery.  CHG is an antiseptic cleaner which kills germs and bonds with the skin to continue killing germs even after washing. Please DO NOT use if you have an allergy to CHG or antibacterial soaps.  If your skin becomes reddened/irritated stop using the CHG and inform your nurse when you arrive at Short Stay. Do not shave (including legs and underarms) for at least 48 hours prior to the first CHG shower.  You may shave your face/neck. Please follow these instructions carefully:  1.  Shower with  CHG Soap the night before surgery and the  morning of Surgery.  2.  If you choose to wash your hair, wash your hair first as usual with your  normal  shampoo.  3.  After you shampoo, rinse your hair and body thoroughly to remove the  shampoo.                           4.  Use CHG as you would any other liquid soap.  You can apply chg directly  to the skin and wash                       Gently with a scrungie or clean washcloth.  5.  Apply the CHG Soap to your body ONLY FROM THE NECK DOWN.   Do not use on face/ open                           Wound or open sores. Avoid contact with eyes, ears mouth and genitals (private parts).                       Wash face,  Genitals (private parts) with your normal soap.  6.  Wash thoroughly, paying special attention to the area where your surgery  will be performed.  7.  Thoroughly rinse your body with warm water from the neck down.  8.  DO NOT shower/wash with your normal soap after using and rinsing off  the CHG Soap.                9.  Pat yourself dry with a clean towel.            10.  Wear clean pajamas.            11.  Place clean sheets on your bed the night of your first shower and do not  sleep with pets. Day of Surgery : Do not apply any lotions/deodorants the morning of surgery.  Please wear clean clothes to the hospital/surgery center.  FAILURE TO FOLLOW THESE INSTRUCTIONS MAY RESULT IN THE CANCELLATION OF YOUR SURGERY PATIENT SIGNATURE_________________________________  NURSE SIGNATURE__________________________________  ________________________________________________________________________

## 2013-11-24 ENCOUNTER — Ambulatory Visit (HOSPITAL_COMMUNITY)
Admission: RE | Admit: 2013-11-24 | Discharge: 2013-11-24 | Disposition: A | Discharge status: Home / Self Care | Payer: Medicare Other | Source: Ambulatory Visit | Attending: Anesthesiology | Admitting: Anesthesiology

## 2013-11-24 ENCOUNTER — Encounter (HOSPITAL_COMMUNITY)
Admission: RE | Admit: 2013-11-24 | Discharge: 2013-11-24 | Disposition: A | Discharge status: Home / Self Care | Payer: Medicare Other | Source: Ambulatory Visit | Attending: Urology | Admitting: Urology

## 2013-11-24 ENCOUNTER — Encounter (HOSPITAL_COMMUNITY): Payer: Self-pay

## 2013-11-24 DIAGNOSIS — Z01818 Encounter for other preprocedural examination: Secondary | ICD-10-CM | POA: Insufficient documentation

## 2013-11-24 DIAGNOSIS — M4120 Other idiopathic scoliosis, site unspecified: Secondary | ICD-10-CM | POA: Insufficient documentation

## 2013-11-24 DIAGNOSIS — R0602 Shortness of breath: Secondary | ICD-10-CM | POA: Insufficient documentation

## 2013-11-24 DIAGNOSIS — Z01812 Encounter for preprocedural laboratory examination: Secondary | ICD-10-CM | POA: Insufficient documentation

## 2013-11-24 HISTORY — DX: Malignant hyperthermia due to anesthesia, initial encounter: T88.3XXA

## 2013-11-24 LAB — BASIC METABOLIC PANEL
BUN: 17 mg/dL (ref 6–23)
CO2: 27 meq/L (ref 19–32)
CREATININE: 0.84 mg/dL (ref 0.50–1.10)
Calcium: 9.9 mg/dL (ref 8.4–10.5)
Chloride: 105 mEq/L (ref 96–112)
GFR calc Af Amer: 82 mL/min — ABNORMAL LOW (ref >90)
GFR calc non Af Amer: 70 mL/min — ABNORMAL LOW (ref >90)
Glucose, Bld: 81 mg/dL (ref 70–99)
Potassium: 5.2 mEq/L (ref 3.7–5.3)
SODIUM: 140 meq/L (ref 137–147)

## 2013-11-24 LAB — CBC
HCT: 42.8 % (ref 36.0–46.0)
Hemoglobin: 14 g/dL (ref 12.0–15.0)
MCH: 29.9 pg (ref 26.0–34.0)
MCHC: 32.7 g/dL (ref 30.0–36.0)
MCV: 91.3 fL (ref 78.0–100.0)
Platelets: 251 10*3/uL (ref 150–400)
RBC: 4.69 MIL/uL (ref 3.87–5.11)
RDW: 12.8 % (ref 11.5–15.5)
WBC: 5.3 10*3/uL (ref 4.0–10.5)

## 2013-11-24 NOTE — Anesthesia Preprocedure Evaluation (Addendum)
Anesthesia Evaluation  Patient identified by MRN, date of birth, ID band Patient awake    Reviewed: Allergy & Precautions, H&P , NPO status , Patient's Chart, lab work & pertinent test results  History of Anesthesia Complications (+) MALIGNANT HYPERTHERMIA, PSEUDOCHOLINESTERASE DEFICIENCY and history of anesthetic complications (? h/o MH vs pseudocholinesterase deficiency? Old records sent for.)  Airway Mallampati: II TM Distance: >3 FB Neck ROM: Full    Dental no notable dental hx.    Pulmonary COPDCurrent Smoker,  breath sounds clear to auscultation  Pulmonary exam normal       Cardiovascular hypertension, negative cardio ROS  Rhythm:Regular Rate:Normal     Neuro/Psych negative neurological ROS  negative psych ROS   GI/Hepatic negative GI ROS, Neg liver ROS,   Endo/Other  negative endocrine ROS  Renal/GU negative Renal ROS  negative genitourinary   Musculoskeletal  (+) Fibromyalgia -  Abdominal   Peds negative pediatric ROS (+)  Hematology negative hematology ROS (+)   Anesthesia Other Findings   Reproductive/Obstetrics negative OB ROS                         Anesthesia Physical Anesthesia Plan  ASA: III  Anesthesia Plan: Spinal   Post-op Pain Management:    Induction:   Airway Management Planned: Simple Face Mask  Additional Equipment:   Intra-op Plan:   Post-operative Plan:   Informed Consent: I have reviewed the patients History and Physical, chart, labs and discussed the procedure including the risks, benefits and alternatives for the proposed anesthesia with the patient or authorized representative who has indicated his/her understanding and acceptance.   Dental advisory given  Plan Discussed with: CRNA  Anesthesia Plan Comments: (Will do spinal to avoid MH or pseudocholinesease problem.)        Anesthesia Quick Evaluation

## 2013-11-24 NOTE — Progress Notes (Signed)
11/13/2013-Progress notes from [facility] from PACU record and Operative note on chart.

## 2013-11-28 ENCOUNTER — Encounter: Payer: Self-pay | Admitting: Emergency Medicine

## 2013-11-28 ENCOUNTER — Ambulatory Visit (INDEPENDENT_AMBULATORY_CARE_PROVIDER_SITE_OTHER): Payer: Medicare Other | Admitting: Emergency Medicine

## 2013-11-28 VITALS — BP 100/60 | HR 82 | Temp 98.2°F | Resp 18 | Ht 68.0 in | Wt 133.0 lb

## 2013-11-28 DIAGNOSIS — R05 Cough: Secondary | ICD-10-CM

## 2013-11-28 DIAGNOSIS — R059 Cough, unspecified: Secondary | ICD-10-CM

## 2013-11-28 MED ORDER — ALBUTEROL SULFATE HFA 108 (90 BASE) MCG/ACT IN AERS: 2.0000 | INHALATION_SPRAY | Freq: Four times a day (QID) | RESPIRATORY_TRACT | Status: DC | PRN

## 2013-11-28 MED ORDER — BENZONATATE 100 MG PO CAPS: 100.0000 mg | ORAL_CAPSULE | Freq: Three times a day (TID) | ORAL | Status: DC | PRN

## 2013-11-28 MED ORDER — AZITHROMYCIN 250 MG PO TABS: ORAL_TABLET | ORAL | Status: AC

## 2013-11-28 NOTE — Progress Notes (Signed)
Subjective:    Patient ID: Sonya Banks, female    DOB: 1946/03/18, 68 y.o.   MRN: [id number]  HPI Comments: 68 yo AAF has surgery scheduled on 6/27 for bladder cancer. She has been sick over last couple of days. She is feeling more fatigued. SHe has noticed increased yellow cough production. She has increased congestion and ear pressure. She denies any OTC relief.  She has decreased tobacco a little.  Cough Associated symptoms include ear pain and headaches.  Headache  Associated symptoms include coughing and ear pain.  Sinusitis Associated symptoms include congestion, coughing, ear pain and headaches.  Otalgia  Associated symptoms include coughing and headaches.     Medication List       This list is accurate as of: 11/28/13  2:33 PM.  Always use your most recent med list.               aspirin 81 MG tablet  Take 81 mg by mouth daily.     buPROPion 150 MG 24 hr tablet  Commonly known as:  WELLBUTRIN XL  Take 150 mg by mouth every morning.     buPROPion 300 MG 24 hr tablet  Commonly known as:  WELLBUTRIN XL  Take 300 mg by mouth every morning.     cyclobenzaprine 10 MG tablet  Commonly known as:  FLEXERIL  Take 10 mg by mouth 3 (three) times daily as needed for muscle spasms.     Fish Oil 1000 MG Cpdr  Take 1,000 mg by mouth daily.     FLUoxetine 20 MG capsule  Commonly known as:  PROZAC  Take 20 mg by mouth every morning.     LORazepam 2 MG tablet  Commonly known as:  ATIVAN  Take 2 mg by mouth daily as needed for anxiety.     Magnesium 250 MG Tabs  Take 250 mg by mouth daily.     meloxicam 7.5 MG tablet  Commonly known as:  MOBIC  Take 7.5 mg by mouth daily.     multivitamin with minerals Tabs tablet  Take 1 tablet by mouth daily.     verapamil 240 MG CR tablet  Commonly known as:  CALAN-SR  Take 120 mg by mouth at bedtime as needed (when she is having cluster headaches).     VITAMIN D PO  Take 5,000 Units by mouth daily.        Allergies  Allergen Reactions  . Bee Venom Anaphylaxis  . Sulfa Antibiotics Swelling     Starts Internally, hard to breath , with large patches of hives.  . Ibuprofen Other (See Comments)    Patient's family has a history of kidney problems and MD stated not to take Ibuprofen.   Past Medical History  Diagnosis Date  . Concussion syndrome   . Migraine   . Cluster headache   . PTSD (post-traumatic stress disorder)   . Hypertension   . COPD (chronic obstructive pulmonary disease)   . Hyperlipidemia   . Fibromyalgia   . Anemia   . Depression   . Malignant hyperthermia 1980    for D&C      Review of Systems  HENT: Positive for congestion and ear pain.   Respiratory: Positive for cough.   Neurological: Positive for headaches.  All other systems reviewed and are negative.  BP 100/60  Pulse 82  Temp(Src) 98.2 F (36.8 C) (Temporal)  Resp 18  Ht 5\' 8"  (1.727 m)  Wt 133 lb (60.328 kg)  BMI 20.23 kg/m2  SpO2 96%     Objective:   Physical Exam  Nursing note and vitals reviewed. Constitutional: She is oriented to person, place, and time. She appears well-developed and well-nourished.  HENT:  Head: Normocephalic and atraumatic.  Right Ear: External ear normal.  Left Ear: External ear normal.  Nose: Nose normal.  Mouth/Throat: Oropharynx is clear and moist. No oropharyngeal exudate.  Yellow TMs bilateral   Eyes: Conjunctivae and EOM are normal.  Neck: Normal range of motion.  Cardiovascular: Normal rate, regular rhythm, normal heart sounds and intact distal pulses.   Pulmonary/Chest: Effort normal and breath sounds normal.  Congested Breath sounds, clears some with cough ? rhonchi   Musculoskeletal: Normal range of motion.  Lymphadenopathy:    She has no cervical adenopathy.  Neurological: She is alert and oriented to person, place, and time.  Skin: Skin is warm and dry.  Psychiatric: She has a normal mood and affect. Judgment normal.          Assessment &  Plan:  1. Cough with tobacco hx and upcoming surgery- Zpak, Tessalon perles, Albuterol HFA all AD, w/c if SX increase or ER.   2. Tobacco Dep- advised cessation techniques and need for d/c to decrease Risk

## 2013-11-28 NOTE — Patient Instructions (Signed)
Pneumonia, Adult °Pneumonia is an infection of the lungs. It may be caused by a germ (virus or bacteria). Some types of pneumonia can spread easily from person to person. This can happen when you cough or sneeze. °HOME CARE °· Only take medicine as told by your doctor. °· Take your medicine (antibiotics) as told. Finish it even if you start to feel better. °· Do not smoke. °· You may use a vaporizer or humidifier in your room. This can help loosen thick spit (mucus). °· Sleep so you are almost sitting up (semi-upright). This helps reduce coughing. °· Rest. °A shot (vaccine) can help prevent pneumonia. Shots are often advised for: °· People over 65 years old. °· Patients on chemotherapy. °· People with long-term (chronic) lung problems. °· People with immune system problems. °GET HELP RIGHT AWAY IF:  °· You are getting worse. °· You cannot control your cough, and you are losing sleep. °· You cough up blood. °· Your pain gets worse, even with medicine. °· You have a fever. °· Any of your problems are getting worse, not better. °· You have shortness of breath or chest pain. °MAKE SURE YOU:  °· Understand these instructions. °· Will watch your condition. °· Will get help right away if you are not doing well or get worse. °Document Released: 11/18/2007 Document Revised: 08/24/2011 Document Reviewed: 08/22/2010 °[employer]® Patient Information ©2014 [employer]. ° °

## 2013-11-30 NOTE — H&P (Signed)
History of Present Illness F/u - referred by Dr. Melford Aase.       1-MH - May 2015 - about 6 months ago patient developed dark colored urine. More recently she passed a blood clot. Three months ago pt was seen when a UA revealed 11-20 rbc/hpf and 7-10 wbc/hpf, no bacteria, urine cx negative. She's had no dysuria or pain. She's had no weak stream, frequency or urgency.   She has several exposures including years of smoking as well as oil-based paint and solvents. She is a Curator. UA today shows pyuria, microscopic hematuria, few bacteria.       June 2015 interval history  She returned for exam and cystoscopy. I reviewed the CT scan which shows a 3 cm papillary bladder tumor. There was no evidence of metastatic disease. It appeared to be an early stage. Upper tracts appeared normal.     Past Medical History Problems  1. History of Anxiety (300.00) 2. History of depression (V11.8) 3. History of esophageal reflux (V12.79) 4. History of renal calculi (V13.01)  Surgical History Problems  1. History of Hernia Repair 2. History of Hysterectomy 3. History of Umbilical Hernia Repair  Current Meds 1. EpiPen DEVI;  Therapy: (Recorded:28May2015) to Recorded 2. Fish Oil CAPS;  Therapy: (Recorded:28May2015) to Recorded 3. Flaxseed Oil CAPS;  Therapy: (Recorded:28May2015) to Recorded 4. FLUoxetine HCl CAPS;  Therapy: (Recorded:28May2015) to Recorded 5. LORazepam TABS;  Therapy: (Recorded:28May2015) to Recorded 6. Meloxicam TABS;  Therapy: (Recorded:28May2015) to Recorded 7. Multi Vitamin Daily TABS;  Therapy: (Recorded:28May2015) to Recorded 8. Verapamil HCl TABS;  Therapy: (Recorded:28May2015) to Recorded 9. Vitamin D TABS;  Therapy: (Recorded:28May2015) to Recorded  Allergies Medication  1. Sulfa Drugs Non-Medication  2. Bee sting  Family History Problems  1. Family history of Aneurysm 2. Family history of Deceased : Daughter 3. Family history of polycystic kidney  disease (V18.61) 4. Family history of renal failure (V18.69) : Sibling 5. Family history of tuberculosis (V18.8) : Aunt  Social History Problems  1. Caffeine use (V49.89) 2. Divorced 3. Number of children 4. Occupation 5. Smokes tobacco daily (305.1) 6. Social alcohol use  Vitals Vital Signs [Data Includes: Last 1 Day]  Recorded: [id number] 11:37AM  Blood Pressure: 133 / 81 Temperature: 97.5 F Heart Rate: 78  Physical Exam Abdomen: The abdomen is soft and nontender. No masses are palpated. No CVA tenderness. No hernias are palpable. No hepatosplenomegaly noted.  Genitourinary:  Chaperone Present: erica.  Examination of the external genitalia shows normal female external genitalia and no lesions. The urethra is normal in appearance and not tender. There is no urethral mass. Vaginal exam demonstrates no abnormalities. The adnexa are palpably normal. The bladder is non tender and not distended. The anus is normal on inspection. The perineum is normal on inspection.    Results/Data Urine [Data Includes: Last 1 Day]   [id number]  COLOR YELLOW   APPEARANCE CLEAR   SPECIFIC GRAVITY 1.010   pH 6.0   GLUCOSE NEG mg/dL  BILIRUBIN NEG   KETONE NEG mg/dL  BLOOD MOD   PROTEIN NEG mg/dL  UROBILINOGEN 0.2 mg/dL  NITRITE NEG   LEUKOCYTE ESTERASE NEG   SQUAMOUS EPITHELIAL/HPF NONE SEEN   WBC 7-10 WBC/hpf  RBC 11-20 RBC/hpf  BACTERIA FEW   CRYSTALS NONE SEEN   CASTS NONE SEEN    The following images/tracing/specimen were independently visualized: Marland Kitchen    Procedure  Procedure: Cystoscopy  Chaperone Present: erica.  Indication: Hematuria. History of Urothelial Carcinoma.  Informed Consent: Risks, benefits, and potential  adverse events were discussed and informed consent was obtained from the patient.  Prep: The patient was prepped with betadine.  Antibiotic prophylaxis: Ciprofloxacin.  Procedure Note:  Urethral meatus:. No abnormalities.  Anterior urethra: No abnormalities.   Bladder: Visulization was clear. The ureteral orifices were in the normal anatomic position bilaterally and had clear efflux of urine. A papillary tumor was seen in the bladder. This tumor was located toward the midline, on the posterior aspect of the bladder. The patient tolerated the procedure well.  Complications: None.    Assessment Assessed  1. Bladder neoplasm (239.4)  Plan Bladder neoplasm  1. Follow-up Schedule Surgery Office  Follow-up  Status: Hold For - Appointment   Requested for: [id number] Health Maintenance  2. UA With REFLEX; [Do Not Release]; Status:Complete;   Done: [id number] 11:30AM 3. UA With REFLEX; [Do Not Release]; Status:Resulted - Requires Verification;   Done:  [id number] 11:31AM  Discussion/Summary   Bladder neoplasm-we discussed the CT and cystoscopy findings. We discussed the nature risk and benefits of TURBT with postoperative mitomycin C. We discussed the nature of bladder tumors to recur in progress, potential need for restaging biopsies, potential need for further treatments in office. All questions answered. I reviewed the CT scan images with her. She elects to proceed with TURBT and mitomycin C. She understands she may need a Foley catheter postoperatively sometimes up to a few weeks.     Signatures Electronically signed by : Festus Aloe, M.D.; Nov 20 2013 12:37PM EST

## 2013-12-04 ENCOUNTER — Telehealth: Payer: Self-pay | Admitting: *Deleted

## 2013-12-04 NOTE — Telephone Encounter (Signed)
PT asking to speak with nurse to update on haw shes feeling says she is having surgery tomorrow & wants to let the "nurse" know.

## 2013-12-04 NOTE — Telephone Encounter (Signed)
Did they leave a name and number to call back to?

## 2013-12-05 ENCOUNTER — Encounter (HOSPITAL_COMMUNITY): Payer: Medicare Other | Admitting: Anesthesiology

## 2013-12-05 ENCOUNTER — Encounter (HOSPITAL_COMMUNITY): Payer: Self-pay | Admitting: *Deleted

## 2013-12-05 ENCOUNTER — Ambulatory Visit (HOSPITAL_COMMUNITY): Payer: Medicare Other | Admitting: Anesthesiology

## 2013-12-05 ENCOUNTER — Encounter (HOSPITAL_COMMUNITY)
Admission: RE | Disposition: A | Discharge status: Home / Self Care | Payer: Self-pay | Source: Ambulatory Visit | Attending: Urology

## 2013-12-05 ENCOUNTER — Ambulatory Visit (HOSPITAL_COMMUNITY)
Admission: RE | Admit: 2013-12-05 | Discharge: 2013-12-05 | Disposition: A | Discharge status: Home / Self Care | Payer: Medicare Other | Source: Ambulatory Visit | Attending: Urology | Admitting: Urology

## 2013-12-05 DIAGNOSIS — J4489 Other specified chronic obstructive pulmonary disease: Secondary | ICD-10-CM | POA: Insufficient documentation

## 2013-12-05 DIAGNOSIS — J449 Chronic obstructive pulmonary disease, unspecified: Secondary | ICD-10-CM | POA: Insufficient documentation

## 2013-12-05 DIAGNOSIS — F329 Major depressive disorder, single episode, unspecified: Secondary | ICD-10-CM | POA: Insufficient documentation

## 2013-12-05 DIAGNOSIS — D494 Neoplasm of unspecified behavior of bladder: Secondary | ICD-10-CM

## 2013-12-05 DIAGNOSIS — I10 Essential (primary) hypertension: Secondary | ICD-10-CM | POA: Insufficient documentation

## 2013-12-05 DIAGNOSIS — F17200 Nicotine dependence, unspecified, uncomplicated: Secondary | ICD-10-CM | POA: Insufficient documentation

## 2013-12-05 DIAGNOSIS — C679 Malignant neoplasm of bladder, unspecified: Secondary | ICD-10-CM | POA: Insufficient documentation

## 2013-12-05 DIAGNOSIS — F3289 Other specified depressive episodes: Secondary | ICD-10-CM | POA: Insufficient documentation

## 2013-12-05 DIAGNOSIS — F411 Generalized anxiety disorder: Secondary | ICD-10-CM | POA: Insufficient documentation

## 2013-12-05 DIAGNOSIS — K219 Gastro-esophageal reflux disease without esophagitis: Secondary | ICD-10-CM | POA: Insufficient documentation

## 2013-12-05 DIAGNOSIS — Z79899 Other long term (current) drug therapy: Secondary | ICD-10-CM | POA: Insufficient documentation

## 2013-12-05 DIAGNOSIS — IMO0001 Reserved for inherently not codable concepts without codable children: Secondary | ICD-10-CM | POA: Insufficient documentation

## 2013-12-05 DIAGNOSIS — Z87442 Personal history of urinary calculi: Secondary | ICD-10-CM | POA: Insufficient documentation

## 2013-12-05 HISTORY — PX: TRANSURETHRAL RESECTION OF BLADDER TUMOR: SHX2575

## 2013-12-05 SURGERY — TURBT (TRANSURETHRAL RESECTION OF BLADDER TUMOR)
Anesthesia: Spinal

## 2013-12-05 MED ORDER — PHENYLEPHRINE 40 MCG/ML (10ML) SYRINGE FOR IV PUSH (FOR BLOOD PRESSURE SUPPORT)
PREFILLED_SYRINGE | INTRAVENOUS | Status: AC
  Filled 2013-12-05: qty 10

## 2013-12-05 MED ORDER — MIDAZOLAM HCL 2 MG/2ML IJ SOLN
INTRAMUSCULAR | Status: AC
  Filled 2013-12-05: qty 2

## 2013-12-05 MED ORDER — SODIUM CHLORIDE 0.9 % IR SOLN
Status: DC | PRN
  Administered 2013-12-05: 18000 mL via INTRAVESICAL

## 2013-12-05 MED ORDER — PROPOFOL INFUSION 10 MG/ML OPTIME
INTRAVENOUS | Status: DC | PRN
  Administered 2013-12-05: 100 ug/kg/min via INTRAVENOUS

## 2013-12-05 MED ORDER — LACTATED RINGERS IV SOLN
INTRAVENOUS | Status: DC | PRN
  Administered 2013-12-05 (×2): via INTRAVENOUS

## 2013-12-05 MED ORDER — FENTANYL CITRATE 0.05 MG/ML IJ SOLN
INTRAMUSCULAR | Status: AC
  Filled 2013-12-05: qty 2

## 2013-12-05 MED ORDER — FENTANYL CITRATE 0.05 MG/ML IJ SOLN
INTRAMUSCULAR | Status: DC | PRN
  Administered 2013-12-05: 25 ug via INTRAVENOUS

## 2013-12-05 MED ORDER — LACTATED RINGERS IV SOLN: INTRAVENOUS | Status: DC

## 2013-12-05 MED ORDER — PROPOFOL 10 MG/ML IV BOLUS
INTRAVENOUS | Status: AC
  Filled 2013-12-05: qty 20

## 2013-12-05 MED ORDER — NITROFURANTOIN MONOHYD MACRO 100 MG PO CAPS: 100.0000 mg | ORAL_CAPSULE | Freq: Every day | ORAL | Status: DC

## 2013-12-05 MED ORDER — MEPERIDINE HCL 50 MG/ML IJ SOLN: 6.2500 mg | INTRAMUSCULAR | Status: DC | PRN

## 2013-12-05 MED ORDER — ASPIRIN 81 MG PO TABS: 81.0000 mg | ORAL_TABLET | Freq: Every day | ORAL | Status: DC

## 2013-12-05 MED ORDER — CEFAZOLIN SODIUM-DEXTROSE 2-3 GM-% IV SOLR
INTRAVENOUS | Status: AC
  Filled 2013-12-05: qty 50

## 2013-12-05 MED ORDER — PHENYLEPHRINE HCL 10 MG/ML IJ SOLN
INTRAMUSCULAR | Status: DC | PRN
  Administered 2013-12-05 (×6): 40 ug via INTRAVENOUS

## 2013-12-05 MED ORDER — LIDOCAINE HCL (CARDIAC) 20 MG/ML IV SOLN
INTRAVENOUS | Status: AC
  Filled 2013-12-05: qty 5

## 2013-12-05 MED ORDER — FENTANYL CITRATE 0.05 MG/ML IJ SOLN: 25.0000 ug | INTRAMUSCULAR | Status: DC | PRN

## 2013-12-05 MED ORDER — LIDOCAINE HCL 2 % EX GEL
CUTANEOUS | Status: AC
  Filled 2013-12-05: qty 10

## 2013-12-05 MED ORDER — MIDAZOLAM HCL 5 MG/5ML IJ SOLN
INTRAMUSCULAR | Status: DC | PRN
  Administered 2013-12-05: 2 mg via INTRAVENOUS

## 2013-12-05 MED ORDER — METOCLOPRAMIDE HCL 5 MG/ML IJ SOLN
INTRAMUSCULAR | Status: DC | PRN
  Administered 2013-12-05: 10 mg via INTRAVENOUS

## 2013-12-05 MED ORDER — CEFAZOLIN SODIUM-DEXTROSE 2-3 GM-% IV SOLR
2.0000 g | INTRAVENOUS | Status: AC
  Administered 2013-12-05: 2 g via INTRAVENOUS

## 2013-12-05 MED ORDER — PROMETHAZINE HCL 25 MG/ML IJ SOLN: 6.2500 mg | INTRAMUSCULAR | Status: DC | PRN

## 2013-12-05 MED ORDER — BELLADONNA ALKALOIDS-OPIUM 16.2-60 MG RE SUPP
RECTAL | Status: AC
  Filled 2013-12-05: qty 1

## 2013-12-05 MED ORDER — TRAMADOL HCL 50 MG PO TABS: 50.0000 mg | ORAL_TABLET | Freq: Four times a day (QID) | ORAL | Status: DC | PRN

## 2013-12-05 MED ORDER — PROPOFOL 10 MG/ML IV BOLUS
INTRAVENOUS | Status: DC | PRN
  Administered 2013-12-05: 20 mg via INTRAVENOUS

## 2013-12-05 MED ORDER — LACTATED RINGERS IV SOLN
Freq: Once | INTRAVENOUS | Status: AC
  Administered 2013-12-05: 1000 mL via INTRAVENOUS

## 2013-12-05 MED ORDER — LIDOCAINE HCL (CARDIAC) 20 MG/ML IV SOLN
INTRAVENOUS | Status: DC | PRN
  Administered 2013-12-05: 50 mg via INTRAVENOUS

## 2013-12-05 SURGICAL SUPPLY — 20 items
BAG URINE DRAINAGE (UROLOGICAL SUPPLIES) IMPLANT
BAG URO CATCHER STRL LF (DRAPE) ×2 IMPLANT
CATH FOLEY 2WAY SLVR  5CC 16FR (CATHETERS)
CATH FOLEY 2WAY SLVR  5CC 18FR (CATHETERS) ×1
CATH FOLEY 2WAY SLVR 5CC 16FR (CATHETERS) IMPLANT
CATH FOLEY 2WAY SLVR 5CC 18FR (CATHETERS) IMPLANT
DRAPE CAMERA CLOSED 9X96 (DRAPES) ×2 IMPLANT
ELECT BUTTON HF 24-28F 2 30DE (ELECTRODE) ×2 IMPLANT
ELECT LOOP MED HF 24F 12D (CUTTING LOOP) ×2 IMPLANT
ELECT LOOP MED HF 24F 12D CBL (CLIP) ×3 IMPLANT
ELECT REM PT RETURN 9FT ADLT (ELECTROSURGICAL) ×2
ELECT RESECT VAPORIZE 12D CBL (ELECTRODE) IMPLANT
ELECTRODE REM PT RTRN 9FT ADLT (ELECTROSURGICAL) ×1 IMPLANT
GLOVE BIOGEL M STRL SZ7.5 (GLOVE) ×2 IMPLANT
GOWN STRL REUS W/TWL XL LVL3 (GOWN DISPOSABLE) ×2 IMPLANT
KIT ASPIRATION TUBING (SET/KITS/TRAYS/PACK) IMPLANT
MANIFOLD NEPTUNE II (INSTRUMENTS) ×2 IMPLANT
PACK CYSTO (CUSTOM PROCEDURE TRAY) ×2 IMPLANT
SYRINGE IRR TOOMEY STRL 70CC (SYRINGE) ×1 IMPLANT
TUBING CONNECTING 10 (TUBING) ×2 IMPLANT

## 2013-12-05 NOTE — Progress Notes (Signed)
Spoke to dr Thomes Cake, anesth re pts recent use of z-pac and albuterol and dx of "pneumonia" by dr Melford Aase.  No new orders given

## 2013-12-05 NOTE — Telephone Encounter (Signed)
Left message on machine at 8:58am.

## 2013-12-05 NOTE — Interval H&P Note (Signed)
History and Physical Interval Note:  12/05/2013 12:13 PM  Sonya Banks  has presented today for surgery, with the diagnosis of BLADDER NEOPLASM   The various methods of treatment have been discussed with the patient and family. After consideration of risks, benefits and other options for treatment, the patient has consented to  Procedure(s): TRANSURETHRAL RESECTION OF BLADDER TUMOR (TURBT) WITH MITOMYCIN C (N/A) as a surgical intervention .  The patient's history has been reviewed, patient examined, no change in status, stable for surgery.  I have reviewed the patient's chart and labs. She took a z-pack and albuterol last week for some chest congestion. This cleared. She has no CP, SOB or congestion today. No fever or dysuria. I discussed with the patient the nature, potential benefits, risks and alternatives to TURBT with mitomycin-C, including side effects of the proposed treatment, the likelihood of the patient achieving the goals of the procedure, and any potential problems that might occur during the procedure or recuperation. All questions answered. Patient elects to proceed.     Festus Aloe

## 2013-12-05 NOTE — Op Note (Signed)
Preoperative diagnosis: Bladder tumor Postoperative diagnosis: Bladder tumor  Procedure: TURBT 2-5 cm  Surgeon: Samara Deist: [place]  Type of anesthesia:  spinal  Findings: On cystoscopy there was a broad-based, solid posterior tumor with surrounding small superficial papillary tumors. There was a raised area of mucosa on the trigone and I wasn't sure if it wouldn't from the scope rubbing this area or a tumor selective a small biopsy. No other tumors.  Description of procedure: After consent was obtained patient brought to the operating room. After adequate anesthesia she was placed in lithotomy position and prepped and draped in the usual sterile fashion. A timeout was performed to confirm the patient and procedure. Cystoscope was placed and the bladder carefully examined with a 12 and 70 lens. I then used the cold cup biopsy forceps to take a biopsy of the small area on the trigone that looked suspicious as well as some suspicious appearing mucosa superior to the large tumor. These were labeled individually.  Next I resected the tumor down to its base. It was solid and broad-based. These chips were sent as bladder tumor chips.  Next I resected the base down to muscle. There was no perforation. There was equal return. Due to cautery artifact there may be some residual tumor but there was an excellent resection overall. I was pleased of the resection. There was fulgurated and under low pressure there was excellent hemostasis. All the chips were drained and evacuated. The ureteral orifices were normal and not involved. Again the bladder filled normally and the scope was removed. An 72 French Foley was placed with 5 cc in the balloon.   The patient was awakened and taken to recovery room in stable condition.   Blood loss: 15 mL   Complications: None   Drains: 18 French Foley which will be removed once patient is ambulatory  Specimens:  #1 trigone biopsy  #2 posterior biopsy superior  to the main tumor  #3 bladder tumor chips  #4 bladder tumor base

## 2013-12-05 NOTE — Discharge Instructions (Signed)
Transurethral Resection, Bladder Tumor Cystoscopy, Care After Refer to this sheet in the next few weeks. These instructions provide you with information on caring for yourself after your procedure. Your caregiver may also give you more specific instructions. Your treatment has been planned according to current medical practices, but problems sometimes occur. Call your caregiver if you have any problems or questions after your procedure. HOME CARE INSTRUCTIONS  Things you can do to ease any discomfort after your procedure include:  Drinking enough water and fluids to keep your urine clear or pale yellow.  Taking a warm bath to relieve any burning feelings. SEEK IMMEDIATE MEDICAL CARE IF:   You have an increase in blood in your urine.  You notice blood clots in your urine.  You have difficulty passing urine.  You have the chills.  You have abdominal pain.  You have a fever or persistent symptoms for more than 2-3 days.  You have a fever and your symptoms suddenly get worse. MAKE SURE YOU:   Understand these instructions.  Will watch your condition.  Will get help right away if you are not doing well or get worse. Document Released: 12/19/2004 Document Revised: 02/01/2013 Document Reviewed: 11/23/2011 [facility] Patient Information 2015 [place], [place]. This information is not intended to replace advice given to you by your health care provider. Make sure you discuss any questions you have with your health care provider. You Can Quit Smoking If you are ready to quit smoking or are thinking about it, congratulations! You have chosen to help yourself be healthier and live longer! There are lots of different ways to quit smoking. Nicotine gum, nicotine patches, a nicotine inhaler, or nicotine nasal spray can help with physical craving. Hypnosis, support groups, and medicines help break the habit of smoking. TIPS TO GET OFF AND STAY OFF CIGARETTES  Learn to predict your moods. Do not let  a bad situation be your excuse to have a cigarette. Some situations in your life might tempt you to have a cigarette.  Ask friends and co-workers not to smoke around you.  Make your home smoke-free.  Never have "just one" cigarette. It leads to wanting another and another. Remind yourself of your decision to quit.  On a card, make a list of your reasons for not smoking. Read it at least the same number of times a day as you have a cigarette. Tell yourself everyday, "I do not want to smoke. I choose not to smoke."  Ask someone at home or work to help you with your plan to quit smoking.  Have something planned after you eat or have a cup of coffee. Take a walk or get other exercise to perk you up. This will help to keep you from overeating.  Try a relaxation exercise to calm you down and decrease your stress. Remember, you may be tense and nervous the first two weeks after you quit. This will pass.  Find new activities to keep your hands busy. Play with a pen, coin, or rubber band. Doodle or draw things on paper.  Brush your teeth right after eating. This will help cut down the craving for the taste of tobacco after meals. You can try mouthwash too.  Try gum, breath mints, or diet candy to keep something in your mouth. IF YOU SMOKE AND WANT TO QUIT:  Do not stock up on cigarettes. Never buy a carton. Wait until one pack is finished before you buy another.  Never carry cigarettes with you at work  or at home.  Keep cigarettes as far away from you as possible. Leave them with someone else.  Never carry matches or a lighter with you.  Ask yourself, "Do I need this cigarette or is this just a reflex?"  Bet with someone that you can quit. Put cigarette money in a piggy bank every morning. If you smoke, you give up the money. If you do not smoke, by the end of the week, you keep the money.  Keep trying. It takes 21 days to change a habit!  Talk to your doctor about using medicines to help  you quit. These include nicotine replacement gum, lozenges, or skin patches. Document Released: 03/28/2009 Document Revised: 08/24/2011 Document Reviewed: 03/28/2009 [facility] Patient Information 2015 [place], [place]. This information is not intended to replace advice given to you by your health care provider. Make sure you discuss any questions you have with your health care provider. Smoking Cessation, Tips for Success If you are ready to quit smoking, congratulations! You have chosen to help yourself be healthier. Cigarettes bring nicotine, tar, carbon monoxide, and other irritants into your body. Your lungs, heart, and blood vessels will be able to work better without these poisons. There are many different ways to quit smoking. Nicotine gum, nicotine patches, a nicotine inhaler, or nicotine nasal spray can help with physical craving. Hypnosis, support groups, and medicines help break the habit of smoking. WHAT THINGS CAN I DO TO MAKE QUITTING EASIER?  Here are some tips to help you quit for good:  Pick a date when you will quit smoking completely. Tell all of your friends and family about your plan to quit on that date.  Do not try to slowly cut down on the number of cigarettes you are smoking. Pick a quit date and quit smoking completely starting on that day.  Throw away all cigarettes.   Clean and remove all ashtrays from your home, work, and car.   On a card, write down your reasons for quitting. Carry the card with you and read it when you get the urge to smoke.   Cleanse your body of nicotine. Drink enough water and fluids to keep your urine clear or pale yellow. Do this after quitting to flush the nicotine from your body.   Learn to predict your moods. Do not let a bad situation be your excuse to have a cigarette. Some situations in your life might tempt you into wanting a cigarette.   Never have "just one" cigarette. It leads to wanting another and another. Remind yourself of  your decision to quit.   Change habits associated with smoking. If you smoked while driving or when feeling stressed, try other activities to replace smoking. Stand up when drinking your coffee. Brush your teeth after eating. Sit in a different chair when you read the paper. Avoid alcohol while trying to quit, and try to drink fewer caffeinated beverages. Alcohol and caffeine may urge you to smoke.   Avoid foods and drinks that can trigger a desire to smoke, such as sugary or spicy foods and alcohol.   Ask people who smoke not to smoke around you.   Have something planned to do right after eating or having a cup of coffee. For example, plan to take a walk or exercise.   Try a relaxation exercise to calm you down and decrease your stress. Remember, you may be tense and nervous for the first 2 weeks after you quit, but this will pass.   Find  new activities to keep your hands busy. Play with a pen, coin, or rubber band. Doodle or draw things on paper.   Brush your teeth right after eating. This will help cut down on the craving for the taste of tobacco after meals. You can also try mouthwash.   Use oral substitutes in place of cigarettes. Try using lemon drops, carrots, cinnamon sticks, or chewing gum. Keep them handy so they are available when you have the urge to smoke.   When you have the urge to smoke, try deep breathing.   Designate your home as a nonsmoking area.   If you are a heavy smoker, ask your health care provider about a prescription for nicotine chewing gum. It can ease your withdrawal from nicotine.   Reward yourself. Set aside the cigarette money you save and buy yourself something nice.   Look for support from others. Join a support group or smoking cessation program. Ask someone at home or at work to help you with your plan to quit smoking.   Always ask yourself, "Do I need this cigarette or is this just a reflex?" Tell yourself, "Today, I choose not to  smoke," or "I do not want to smoke." You are reminding yourself of your decision to quit.  Do not replace cigarette smoking with electronic cigarettes (commonly called e-cigarettes). The safety of e-cigarettes is unknown, and some may contain harmful chemicals.  If you relapse, do not give up! Plan ahead and think about what you will do the next time you get the urge to smoke.  HOW WILL I FEEL WHEN I QUIT SMOKING? You may have symptoms of withdrawal because your body is used to nicotine (the addictive substance in cigarettes). You may crave cigarettes, be irritable, feel very hungry, cough often, get headaches, or have difficulty concentrating. The withdrawal symptoms are only temporary. They are strongest when you first quit but will go away within 10-14 days. When withdrawal symptoms occur, stay in control. Think about your reasons for quitting. Remind yourself that these are signs that your body is healing and getting used to being without cigarettes. Remember that withdrawal symptoms are easier to treat than the major diseases that smoking can cause.  Even after the withdrawal is over, expect periodic urges to smoke. However, these cravings are generally short lived and will go away whether you smoke or not. Do not smoke!  WHAT RESOURCES ARE AVAILABLE TO HELP ME QUIT SMOKING? Your health care provider can direct you to community resources or hospitals for support, which may include:  Group support.  Education.  Hypnosis.  Therapy. Document Released: 02/28/2004 Document Revised: 03/22/2013 Document Reviewed: 11/17/2012 [facility] Patient Information 2015 [place], [place]. This information is not intended to replace advice given to you by your health care provider. Make sure you discuss any questions you have with your health care provider.   Foley Catheter Care, Adult A Foley catheter is a soft, flexible tube. This tube is placed into your bladder to drain pee (urine). If you go home with  this catheter in place, follow the instructions below. TAKING CARE OF THE CATHETER 1. Wash your hands with soap and water. 2. Put soap and water on a clean washcloth.  Clean the skin where the tube goes into your body.  Clean away from the tube site.  Never wipe toward the tube.  Clean the area using a circular motion.  Remove all the soap. Pat the area dry with a clean towel. For males, reposition the  skin that covers the end of the penis (foreskin). 3. Attach the tube to your leg with tape or a leg strap. Do not stretch the tube tight. If you are using tape, remove any stickiness left behind by past tape you used. 4. Keep the drainage bag below your hips. Keep it off the floor. 5. Check your tube during the day. Make sure it is working and draining. Make sure the tube does not curl, twist, or bend. 6. Do not pull on the tube or try to take it out. TAKING CARE OF THE DRAINAGE BAGS You will have a large overnight drainage bag and a small leg bag. You may wear the overnight bag any time. Never wear the small bag at night. Follow the directions below. Emptying the Drainage Bag Empty your drainage bag when it is  - full or at least 2-3 times a day. 1. Wash your hands with soap and water. 2. Keep the drainage bag below your hips. 3. Hold the dirty bag over the toilet or clean container. 4. Open the pour spout at the bottom of the bag. Empty the pee into the toilet or container. Do not let the pour spout touch anything. 5. Clean the pour spout with a gauze pad or cotton ball that has rubbing alcohol on it. 6. Close the pour spout. 7. Attach the bag to your leg with tape or a leg strap. 8. Wash your hands well. Changing the Drainage Bag Change your bag once a month or sooner if it starts to smell or look dirty.  1. Wash your hands with soap and water. 2. Pinch the rubber tube so that pee does not spill out. 3. Disconnect the catheter tube from the drainage tube at the connection valve.  Do not let the tubes touch anything. 4. Clean the end of the catheter tube with an alcohol wipe. Clean the end of a the drainage tube with a different alcohol wipe. 5. Connect the catheter tube to the drainage tube of the clean drainage bag. 6. Attach the new bag to the leg with tape or a leg strap. Avoid attaching the new bag too tightly. 7. Wash your hands well. Cleaning the Drainage Bag 1. Wash your hands with soap and water. 2. Wash the bag in warm, soapy water. 3. Rinse the bag with warm water. 4. Fill the bag with a mixture of white vinegar and water (1 c vinegar to 1 qt warm water [.2 L vinegar to 1 L warm water]). Close the bag and soak it for 30 minutes in a solution. 5. Rinse the bag with warm water. 6. Hang the bag to dry with the pour spout open and hanging downward. 7. Store the clean bag (once it is dry) in a clean plastic bag. 8. Wash your hands well. PREVENT INFECTION  Wash your hands before and after touching your tube.  Take showers every day. Wash the skin where the tube enters your body. Do not take baths. Replace wet leg straps with dry ones, if this applies.  Do not use powders, sprays, or lotions on the genital area. Only use creams, lotions, or ointments as told by your doctor.  For females, wipe from front to back after going to the bathroom.  Drink enough fluids to keep your pee clear or pale yellow unless you are told not to have too much fluid (fluid restriction).  Do not let the drainage bag or tubing touch or lie on the floor.  Wear cotton underwear  to keep the area dry. GET HELP RIGHT AWAY IF:  You have pain, puffiness (swelling), redness, or yellowish-white fluid (pus) where the tube enters the body.  You have pain in the belly (abdomen), legs, lower back, or bladder.  You have a fever.  You see blood fill the tube, or your pee is pink or red.  You feel sick to your stomach (nauseous), throw up (vomit), or have chills.  Your tube gets pulled  out.  Your pee is cloudy or smells unusually bad.  Your tube becomes clogged.  You are not draining pee into the bag or your bladder feels full.  Your tube starts to leak. MAKE SURE YOU:   Understand these instructions.  Will watch your condition.  Will get help right away if you are not doing well or get worse. Document Released: 09/26/2012 Document Reviewed: 09/26/2012 [facility] Patient Information 2015 [place]. This information is not intended to replace advice given to you by your health care provider. Make sure you discuss any questions you have with your health care provider.

## 2013-12-05 NOTE — Progress Notes (Signed)
Patient unable to urinate. Foley  Catheter was d/c at 1530. Patient had spinal anesthesia. Up ambulating in hallway. She is beginning to feel pressure like she has to urinate. Bladder scanned for 810 ml. Paged [facility]. Dr Tresa Moore is on call. Orders received to place catheter and send patient home.

## 2013-12-05 NOTE — Transfer of Care (Signed)
Immediate Anesthesia Transfer of Care Note  Patient: Sonya Banks  Procedure(s) Performed: Procedure(s): TRANSURETHRAL RESECTION OF BLADDER , TUMOR (TURBT) , BLADDER BIOPSIES (N/A)  Patient Location: PACU  Anesthesia Type:MAC and Spinal  Level of Consciousness: Patient easily awoken, sedated, comfortable, cooperative, following commands, responds to stimulation.   Airway & Oxygen Therapy: Patient spontaneously breathing, ventilating well, oxygen via simple oxygen mask.  Post-op Assessment: Report given to PACU RN, vital signs reviewed and stable.   Post vital signs: Reviewed and stable.  Complications: No apparent anesthesia complications

## 2013-12-05 NOTE — Telephone Encounter (Signed)
c 

## 2013-12-06 ENCOUNTER — Encounter (HOSPITAL_COMMUNITY): Payer: Self-pay | Admitting: Urology

## 2013-12-06 NOTE — Anesthesia Postprocedure Evaluation (Signed)
  Anesthesia Post-op Note  Patient: Sonya Banks  Procedure(s) Performed: Procedure(s) (LRB): TRANSURETHRAL RESECTION OF BLADDER , TUMOR (TURBT) , BLADDER BIOPSIES (N/A)  Patient Location: PACU  Anesthesia Type: General  Level of Consciousness: awake and alert   Airway and Oxygen Therapy: Patient Spontanous Breathing  Post-op Pain: mild  Post-op Assessment: Post-op Vital signs reviewed, Patient's Cardiovascular Status Stable, Respiratory Function Stable, Patent Airway and No signs of Nausea or vomiting  Last Vitals:  Filed Vitals:   12/05/13 2000  BP: 121/85  Pulse: 71  Temp: 36.7 C  Resp: 14    Post-op Vital Signs: stable   Complications: No apparent anesthesia complications

## 2013-12-07 NOTE — Telephone Encounter (Signed)
Left message on machine at 3:00pm.

## 2013-12-24 DIAGNOSIS — N182 Chronic kidney disease, stage 2 (mild): Secondary | ICD-10-CM | POA: Insufficient documentation

## 2013-12-25 ENCOUNTER — Telehealth: Payer: Self-pay | Admitting: Oncology

## 2013-12-25 NOTE — Telephone Encounter (Signed)
S/W PATIENT AND GAVE NP APPT FOR 07/14 @ 10:30 W/DR. SHADAD.  REFERRING-[facility]-MULTI ONCOLOGY PROGRAM DX- BLADDER CA

## 2013-12-25 NOTE — Telephone Encounter (Signed)
C/D 12/25/13 for appt. 12/26/13 °

## 2013-12-26 ENCOUNTER — Telehealth: Payer: Self-pay | Admitting: Oncology

## 2013-12-26 ENCOUNTER — Encounter: Payer: Self-pay | Admitting: Oncology

## 2013-12-26 ENCOUNTER — Ambulatory Visit (HOSPITAL_BASED_OUTPATIENT_CLINIC_OR_DEPARTMENT_OTHER): Payer: Medicare Other | Admitting: Oncology

## 2013-12-26 ENCOUNTER — Other Ambulatory Visit: Payer: Medicare Other

## 2013-12-26 ENCOUNTER — Ambulatory Visit: Payer: Medicare Other

## 2013-12-26 VITALS — BP 129/76 | HR 81 | Temp 97.7°F | Resp 18 | Ht 68.0 in | Wt 129.4 lb

## 2013-12-26 DIAGNOSIS — C670 Malignant neoplasm of trigone of bladder: Secondary | ICD-10-CM

## 2013-12-26 DIAGNOSIS — C679 Malignant neoplasm of bladder, unspecified: Secondary | ICD-10-CM | POA: Insufficient documentation

## 2013-12-26 DIAGNOSIS — N289 Disorder of kidney and ureter, unspecified: Secondary | ICD-10-CM

## 2013-12-26 DIAGNOSIS — C671 Malignant neoplasm of dome of bladder: Secondary | ICD-10-CM

## 2013-12-26 NOTE — Telephone Encounter (Signed)
Pt confirmed labs/edu/ov per 07/14 POF, gave pt AVS......KJ

## 2013-12-26 NOTE — Progress Notes (Signed)
Checked in new patient with no financial issues prior to seeing the dr. She has appt card and [place] been [place].

## 2013-12-26 NOTE — Consult Note (Signed)
Reason for Referral: Bladder cancer.   HPI: 68 year old woman currently of [place] where she lived the majority of her life. She is a pleasant rather healthy woman without any significant comorbid conditions. She started developing symptoms of hematuria and discoloration of her urine for the last few months. She was initially treated with antibiotics without any response. She subsequently evaluated by Dr. Festus Aloe from urology and a cystoscopy showed a 3 cm papillary bladder tumor that confirmed by CT scan. On 12/05/2013 she underwent a TURBT   of a solid posterior tumor with surrounding small superficial papillary tumors. 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 This was the tumor at the base of the bladder. There was an evidence of urothelial carcinoma in situ as well as benign tissue associated with cystitis. Patient was also evaluated at the genitourinary multidisciplinary clinic at [facility] and options of treatment were discussed with the patient. Initially she was recommended to have a cystectomy after neoadjuvant chemotherapy but the patient opted for bladder preservation and was referred to me for an evaluation.  Clinically she is completely asymptomatic at this point. She is no longer reporting hematuria which have subsided after her TURBT. She did have urinary retention related to her spinal anesthesia but does have resolved. She still have some occasional constipation. She is not reporting any constitutional symptoms at this time. She has not reported any fevers chills or sweats. She has not reported any nausea or vomiting or abdominal pain. Reported any diarrhea but did report constipation. She has not reported any hematochezia or melena. She does report occasional cluster headaches but they are unchanged. She does report anxiety and depression related to PTSD. She has not reported any  petechiae or lymphadenopathy or rash. She has not reported any skeletal complaints. 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 Review of systems unremarkable.   Past Medical History  Diagnosis Date  . Concussion syndrome   . Migraine   . Cluster headache   . PTSD (post-traumatic stress disorder)   . Hypertension   . COPD (chronic obstructive pulmonary disease)   . Hyperlipidemia   . Fibromyalgia   . Anemia   . Depression   . Malignant hyperthermia 1980    for D&C  :  Past Surgical History  Procedure Laterality Date  . Cystectomy    . Hernia repair    . Dilation and curettage of uterus  1980  . Transurethral resection of bladder tumor N/A 12/05/2013    Procedure: TRANSURETHRAL RESECTION OF BLADDER , TUMOR (TURBT) , BLADDER BIOPSIES;  Surgeon: Festus Aloe, MD;  Location: [facility];  Service: Urology;  Laterality: N/A;   Current Outpatient Prescriptions  Medication Sig Dispense Refill  . Green Tea, Camillia sinensis, (GREEN TEA EXTRACT PO) Take by mouth daily.      Marland Kitchen OVER THE COUNTER MEDICATION Take by mouth daily. Broccoli & Kale powder      . albuterol (PROVENTIL HFA;VENTOLIN HFA) 108 (90 BASE) MCG/ACT inhaler Inhale 2 puffs into the lungs every 6 (six) hours as needed for wheezing or shortness of breath.  1 Inhaler  2  . aspirin 81 MG tablet Take 1 tablet (81 mg total) by mouth daily.  30 tablet    . azithromycin (ZITHROMAX) 250 MG tablet Take by mouth daily.      . benzonatate (TESSALON PERLES) 100 MG capsule Take 1 capsule (100 mg total) by mouth  3 (three) times daily as needed.  42 capsule  1  . buPROPion (WELLBUTRIN XL) 150 MG 24 hr tablet Take 150 mg by mouth every morning.      Marland Kitchen buPROPion (WELLBUTRIN XL) 300 MG 24 hr tablet Take 300 mg by mouth every morning.      . Cholecalciferol (VITAMIN D PO) Take 5,000 Units by mouth daily.      . cyclobenzaprine (FLEXERIL) 10 MG tablet Take 10 mg by mouth 3 (three) times daily as needed for  muscle spasms.       Marland Kitchen FLUoxetine (PROZAC) 20 MG capsule Take 20 mg by mouth every morning.       Marland Kitchen LORazepam (ATIVAN) 2 MG tablet Take 2 mg by mouth daily as needed for anxiety.      . Magnesium 250 MG TABS Take 250 mg by mouth daily.       . meloxicam (MOBIC) 7.5 MG tablet Take 7.5 mg by mouth daily.      . Multiple Vitamin (MULTIVITAMIN WITH MINERALS) TABS tablet Take 1 tablet by mouth daily.      . nitrofurantoin, macrocrystal-monohydrate, (MACROBID) 100 MG capsule Take 1 capsule (100 mg total) by mouth at bedtime.  5 capsule  0  . Omega-3 Fatty Acids (FISH OIL) 1000 MG CPDR Take 1,000 mg by mouth daily.       . traMADol (ULTRAM) 50 MG tablet Take 1 tablet (50 mg total) by mouth every 6 (six) hours as needed.  30 tablet  0  . verapamil (CALAN-SR) 240 MG CR tablet Take 120 mg by mouth at bedtime as needed (when she is having cluster headaches).       No current facility-administered medications for this visit.       Allergies  Allergen Reactions  . Bee Venom Anaphylaxis  . Sulfa Antibiotics Swelling, Anaphylaxis, Hives and Shortness Of Breath     Starts Internally, hard to breath , with large patches of hives.  . Ibuprofen Other (See Comments)    Patient's family has a history of kidney problems and MD stated not to take Ibuprofen.  . Prednisone     Pain/ broken blood vessels with high dose  :  Family History  Problem Relation Age of Onset  . Stroke Mother   . Hypertension Mother   . Hypertension Father   . Asthma Sister   . Kidney disease Sister   . Heart attack Brother   :  History   Social History  . Marital Status: Divorced    Spouse Name: N/A    Number of Children: N/A  . Years of Education: N/A   Occupational History  . Not on file.   Social History Main Topics  . Smoking status: Current Every Day Smoker -- 0.50 packs/day for 40 years    Types: Cigarettes  . Smokeless tobacco: Not on file  . Alcohol Use: Yes     Comment: glass of wine every few weeks  .  Drug Use: No  . Sexual Activity: Not Currently   Other Topics Concern  . Not on file   Social History Narrative  . No narrative on file  :  Pertinent items are noted in HPI.  Exam: ECOG 0 Blood pressure 129/76, pulse 81, temperature 97.7 F (36.5 C), temperature source Oral, resp. rate 18, height 5\' 8"  (1.727 m), weight 129 lb 6.4 oz (58.695 kg). General appearance: alert and cooperative Head: Normocephalic, without obvious abnormality Throat: lips, mucosa, and tongue normal; teeth and gums normal  Neck: no adenopathy Back: symmetric, no curvature. ROM normal. No CVA tenderness. Resp: clear to auscultation bilaterally Chest wall: no tenderness Cardio: regular rate and rhythm, S1, S2 normal, no murmur, click, rub or gallop GI: soft, non-tender; bowel sounds normal; no masses,  no organomegaly Extremities: extremities normal, atraumatic, no cyanosis or edema Pulses: 2+ and symmetric Skin: Skin color, texture, turgor normal. No rashes or lesions Lymph nodes: Cervical, supraclavicular, and axillary nodes normal.   CBC    Component Value Date/Time   WBC 5.3 11/24/2013 1035   RBC 4.69 11/24/2013 1035   HGB 14.0 11/24/2013 1035   HCT 42.8 11/24/2013 1035   PLT 251 11/24/2013 1035   MCV 91.3 11/24/2013 1035   MCH 29.9 11/24/2013 1035   MCHC 32.7 11/24/2013 1035   RDW 12.8 11/24/2013 1035   LYMPHSABS 2.3 07/11/2013 1652   MONOABS 0.4 07/11/2013 1652   EOSABS 0.1 07/11/2013 1652   BASOSABS 0.0 07/11/2013 1652        Chemistry      Component Value Date/Time   NA 140 11/24/2013 1035   K 5.2 11/24/2013 1035   CL 105 11/24/2013 1035   CO2 27 11/24/2013 1035   BUN 17 11/24/2013 1035   CREATININE 0.84 11/24/2013 1035   CREATININE 0.77 07/11/2013 1652      Component Value Date/Time   CALCIUM 9.9 11/24/2013 1035   ALKPHOS 84 07/11/2013 1652   AST 21 07/11/2013 1652   ALT 11 07/11/2013 1652   BILITOT 0.4 07/11/2013 1652       Assessment and Plan:   68 year old woman with the following  issues:  1. Muscle invasive bladder cancer diagnosed in June of 2015. She is clinical staging of T2 N0 with a staging workup to include CT scan abdomen and pelvis as well as a chest x-ray that showed no evidence of metastatic disease. She is status post TURBT on 12/05/2013 without any residual tumor. Options of treatments were discussed again today with the patient and her son. The primary surgical option with a radical study after utilizing neoadjuvant chemotherapy was discussed extensively. The risks and benefits of using full dose platinum-based regimen were discussed today. Patient is very hesitant to undergo radical cystectomy and will like to pursue a bladder preservation at this time. The role of chemotherapy with radiation as a primary modality he was discussed as well. I explained to her that the success rate is probably inferior to radical cystectomy by at least 10%. The logistics of administration of chemotherapy with radiation was discussed. Options that include orals alone versus intravenous platinum-based treatment were discussed. I feel she'll probably a good candidate for IV carboplatin. Complications of IV carboplatin given at an AUC of 2 weekly were discussed. These would include nausea, vomiting, myelosuppression, renal toxicity. These complications are less in this setting rather than a neoadjuvant chemotherapy setting.  After lengthy discussion today she is adamant that she wants a bladder preservation and I will refer her for a radiation oncology evaluation at this time.  2. Renal insufficiency: Her creatinine is around 0.84 with a creatinine clearance around 80 cc per minute. This should be adequate for carboplatin administration especially as a chemotherapy sensitizing agent.  Followup: Will be with her first chemotherapy once chemotherapy RT have commenced.

## 2013-12-26 NOTE — Progress Notes (Signed)
Please see consult note.  

## 2013-12-27 ENCOUNTER — Other Ambulatory Visit: Payer: Self-pay | Admitting: Urology

## 2013-12-29 NOTE — Progress Notes (Signed)
GU Location of Tumor / Histology: bladder cancer  Sonya Banks presented with symptoms of hematuria and discoloration of her urine for the last few months.   Biopsies revealed:  12/05/13 Diagnosis 1. Bladder, biopsy, right trigone - BENIGN UROTHELIAL MUCOSA WITH BRUNN NEST AND CYSTITIS CYSTICA. - NO PAPILLARY, IN SITU OR INVASIVE CARCINOMA. 2. Bladder, biopsy, posterior superior - UROTHELIAL CARCINOMA IN SITU. - NO EVIDENCE OF INVASIVE CARCINOMA. 3. Bladder, transurethral resection, bladder tumor - HIGH GRADE INVASIVE UROTHELIAL CARCINOMA. - MUSCULARIS PROPRIA TISSUE NOT IDENTIFIED. 4. Bladder, transurethral resection, bladder tumor base - HIGH GRADE INVASIVE UROTHELIAL CARCINOMA. - MUSCULARIS PROPRIA TISSUE PRESENT AND INVOLVED BY TUMOR.  Past/Anticipated interventions by urology, if any: 12/05/13 - Procedure: TRANSURETHRAL RESECTION OF BLADDER , TUMOR (TURBT) , BLADDER BIOPSIES;  Surgeon: Festus Aloe, MD;  Location: [facility];  Service: Urology;  Laterality: N/A; Will have a repeat TURBT on 01/16/14  Past/Anticipated interventions by medical oncology, if any: IV carboplatin  Weight changes, if any: no - patient reports a poor appetite  Bowel/Bladder complaints, if any: constipation, denies bladder complaints  Nausea/Vomiting, if any: no  Pain issues, if any:  no  SAFETY ISSUES:  Prior radiation? no  Pacemaker/ICD? no  Possible current pregnancy? no  Is the patient on methotrexate? no  Current Complaints / other details:  Patient has 4 children.  She is very anxious about today's appointment.

## 2014-01-02 ENCOUNTER — Other Ambulatory Visit: Payer: Medicare Other

## 2014-01-02 ENCOUNTER — Encounter: Payer: Self-pay | Admitting: *Deleted

## 2014-01-03 ENCOUNTER — Encounter: Payer: Self-pay | Admitting: Radiation Oncology

## 2014-01-03 ENCOUNTER — Ambulatory Visit
Admission: RE | Admit: 2014-01-03 | Discharge: 2014-01-03 | Disposition: A | Discharge status: Home / Self Care | Payer: Medicare Other | Source: Ambulatory Visit | Attending: Radiation Oncology | Admitting: Radiation Oncology

## 2014-01-03 VITALS — BP 140/76 | HR 75 | Temp 98.0°F | Ht 67.0 in | Wt 131.2 lb

## 2014-01-03 DIAGNOSIS — J4489 Other specified chronic obstructive pulmonary disease: Secondary | ICD-10-CM | POA: Insufficient documentation

## 2014-01-03 DIAGNOSIS — J449 Chronic obstructive pulmonary disease, unspecified: Secondary | ICD-10-CM | POA: Insufficient documentation

## 2014-01-03 DIAGNOSIS — G44009 Cluster headache syndrome, unspecified, not intractable: Secondary | ICD-10-CM | POA: Insufficient documentation

## 2014-01-03 DIAGNOSIS — Z510 Encounter for antineoplastic radiation therapy: Secondary | ICD-10-CM | POA: Diagnosis not present

## 2014-01-03 DIAGNOSIS — C679 Malignant neoplasm of bladder, unspecified: Secondary | ICD-10-CM | POA: Insufficient documentation

## 2014-01-03 DIAGNOSIS — Z791 Long term (current) use of non-steroidal anti-inflammatories (NSAID): Secondary | ICD-10-CM | POA: Insufficient documentation

## 2014-01-03 DIAGNOSIS — C674 Malignant neoplasm of posterior wall of bladder: Secondary | ICD-10-CM

## 2014-01-03 DIAGNOSIS — F17200 Nicotine dependence, unspecified, uncomplicated: Secondary | ICD-10-CM | POA: Diagnosis not present

## 2014-01-03 DIAGNOSIS — F3289 Other specified depressive episodes: Secondary | ICD-10-CM | POA: Diagnosis not present

## 2014-01-03 DIAGNOSIS — F329 Major depressive disorder, single episode, unspecified: Secondary | ICD-10-CM | POA: Diagnosis not present

## 2014-01-03 DIAGNOSIS — Z7982 Long term (current) use of aspirin: Secondary | ICD-10-CM | POA: Diagnosis not present

## 2014-01-03 HISTORY — DX: Malignant neoplasm of bladder, unspecified: C67.9

## 2014-01-03 NOTE — Progress Notes (Signed)
Radiation Oncology         [phone]) [phone] ________________________________  Initial outpatient Consultation Note  Name: Sonya Banks MRN: [id number]  Date: 01/03/2014  DOB: 1945/12/05  JK:[id number],XHBZJIR DAVID, MD  Wyatt Portela, MD   REFERRING PHYSICIAN: Wyatt Portela, MD  DIAGNOSIS: cT2, N0, M0  High-grade muscle invasive urothelial carcinoma presenting in the posterior bladder   HISTORY OF PRESENT ILLNESS::Sonya Banks is a 68 y.o. female who is seen out courtesy of Dr. Alen Blew for an opinion concerning radiation therapy as part of management of patient's recently diagnosed muscle invasive bladder cancer. Earlier this year the patient presented with darkening of her urine and some hematuria most recently. Patient was initially treated with antibiotics without improvement. She was then referred to Dr. Eda Keys and cystoscopy revealed approximately 3 cm papillary bladder tumor. A CT scan confirmed the mass along the posterior bladder wall..On 12/05/2013 she underwent a TURBT of a solid posterior tumor with surrounding small superficial papillary tumors. She tolerated the procedure well and the pathology showed high-grade invasive urothelial carcinoma with spread into the muscularis propria. This tumor was  at the base of the bladder. There was an evidence of urothelial carcinoma in situ as well as benign tissue associated with cystitis. She self referred to the genitourinary multidisciplinary clinic at [facility] and options of treatment were discussed with the patient.  she was recommended to have a cystectomy after neoadjuvant chemotherapy but the patient opted for bladder preservation and was referred to Medical oncology for an evaluation.  Dr. Alen Blew also agreed as well as urology with cystectomy for management but the patient does not wish to proceed with this approach prefers bladder preservation. She and her son understand there would be a greater chance for cure with  cystectomy. She is now seen in consultation for radiation therapy.  The patient is accompanied by her son who seems to be very supportive.   PREVIOUS RADIATION THERAPY: No  PAST MEDICAL HISTORY:  has a past medical history of Concussion syndrome; Migraine; Cluster headache; PTSD (post-traumatic stress disorder); COPD (chronic obstructive pulmonary disease); Hyperlipidemia; Fibromyalgia; Anemia; Depression; Malignant hyperthermia (1980); and Bladder cancer.    PAST SURGICAL HISTORY: Past Surgical History  Procedure Laterality Date  . Cystectomy    . Hernia repair      umbilical  . Dilation and curettage of uterus  1980  . Transurethral resection of bladder tumor N/A 12/05/2013    Procedure: TRANSURETHRAL RESECTION OF BLADDER , TUMOR (TURBT) , BLADDER BIOPSIES;  Surgeon: Festus Aloe, MD;  Location: [facility];  Service: Urology;  Laterality: N/A;  . Cyst removal trunk      right side    FAMILY HISTORY: family history includes Asthma in her sister; Heart attack in her brother; Hypertension in her father and mother; Kidney disease in her sister; Stroke in her mother.  SOCIAL HISTORY:  reports that she has been smoking Cigarettes.  She has a 20 pack-year smoking history. She does not have any smokeless tobacco history on file. She reports that she does not drink alcohol or use illicit drugs.  ALLERGIES: Bee venom; Sulfa antibiotics; Ibuprofen; and Prednisone  MEDICATIONS:  Current Outpatient Prescriptions  Medication Sig Dispense Refill  . aspirin 81 MG tablet Take 1 tablet (81 mg total) by mouth daily.  30 tablet    . buPROPion (WELLBUTRIN XL) 150 MG 24 hr tablet Take 150 mg by mouth every morning.      . Cholecalciferol (VITAMIN D PO) Take 5,000 Units  by mouth daily.      . cyclobenzaprine (FLEXERIL) 10 MG tablet Take 10 mg by mouth 3 (three) times daily as needed for muscle spasms.       Marland Kitchen EPINEPHrine (EPIPEN JR) 0.15 MG/0.3ML injection Inject 0.15 mg into the skin.      . Flaxseed,  Linseed, (FLAXSEED OIL) 1000 MG CAPS Take by mouth.      Marland Kitchen FLUoxetine (PROZAC) 20 MG capsule Take 20 mg by mouth every morning.       Nyoka Cowden Tea, Camillia sinensis, (GREEN TEA EXTRACT PO) Take by mouth daily.      Marland Kitchen LORazepam (ATIVAN) 2 MG tablet Take 2 mg by mouth daily as needed for anxiety.      . Magnesium 250 MG TABS Take 250 mg by mouth daily.       . meloxicam (MOBIC) 7.5 MG tablet Take 7.5 mg by mouth daily.      . Multiple Vitamin (MULTIVITAMIN WITH MINERALS) TABS tablet Take 1 tablet by mouth daily.      . Omega-3 Fatty Acids (FISH OIL) 1000 MG CPDR Take 1,000 mg by mouth daily.       Marland Kitchen OVER THE COUNTER MEDICATION Take by mouth daily. Broccoli & Kale powder      . albuterol (PROVENTIL HFA;VENTOLIN HFA) 108 (90 BASE) MCG/ACT inhaler Inhale 2 puffs into the lungs every 6 (six) hours as needed for wheezing or shortness of breath.  1 Inhaler  2  . buPROPion (WELLBUTRIN XL) 300 MG 24 hr tablet Take 300 mg by mouth every morning.      . traMADol (ULTRAM) 50 MG tablet Take 1 tablet (50 mg total) by mouth every 6 (six) hours as needed.  30 tablet  0  . verapamil (CALAN-SR) 240 MG CR tablet Take 120 mg by mouth at bedtime as needed (when she is having cluster headaches).       No current facility-administered medications for this encounter.    REVIEW OF SYSTEMS:  A 15 point review of systems is documented in the electronic medical record. This was obtained by the nursing staff. However, I reviewed this with the patient to discuss relevant findings and make appropriate changes. She denies any further hematuria or difficulties with urination. Patient denies any dysuria or pelvic pain. She denies any change in bowel habits. Patient admits to anxiety and depression related to PTSD. She is extremely anxious about anesthesia and says she slept for 4 days after her last anesthesia.   PHYSICAL EXAM:  height is 5\' 7"  (1.702 m) and weight is 131 lb 3.2 oz (59.512 kg). Her oral temperature is 98 F (36.7  C). Her blood pressure is 140/76 and her pulse is 75.  the patient was not examined today in light of the lengthy  discussion concerning management.    ECOG = 0  0 - Asymptomatic (Fully active, able to carry on all predisease activities without restriction)  LABORATORY DATA:  Lab Results  Component Value Date   WBC 5.3 11/24/2013   HGB 14.0 11/24/2013   HCT 42.8 11/24/2013   MCV 91.3 11/24/2013   PLT 251 11/24/2013   NEUTROABS 2.0 07/11/2013   Lab Results  Component Value Date   NA 140 11/24/2013   K 5.2 11/24/2013   CL 105 11/24/2013   CO2 27 11/24/2013   GLUCOSE 81 11/24/2013   CREATININE 0.84 11/24/2013   CALCIUM 9.9 11/24/2013      RADIOGRAPHY: No results found.  Diagnostic report text  CLINICAL DATA: Microhematuria and gross hematuria  EXAM: 11/16/13 CT ABDOMEN AND PELVIS WITHOUT AND WITH CONTRAST  TECHNIQUE: Multidetector CT imaging of the abdomen and pelvis was performed following the standard protocol before and following the bolus administration of intravenous contrast.  CONTRAST: 125 cc Isovue  COMPARISON: None.  FINDINGS: Renal: Non IV contrast images demonstrate no nephrolithiasis or ureterolithiasis. Cortical phase imaging demonstrates no enhancing renal cortical lesion. Delayed pyelogram phase imaging demonstrates no defects in the collecting systems or ureters.  Bladder: There is a large papillary lesion extending into the lumen of the bladder from the posterior wall of the bladder measuring approximately 3.0 cm x 2.9 cm x 1.6 cm.  Lung bases are. No focal hepatic lesion. The mild intrahepatic extrahepatic biliary dilatation. Common bile duct upper limits of normal at 5 mm. The pancreatic duct is prominent but within normal limits 2 mm. There is no pancreatic mass lesion present. There is a small periampullary diverticulum.  Spleen, adrenal glands, and kidneys are normal.      IMPRESSION:  cT2, N0, M0  High-grade muscle invasive urothelial  carcinoma presenting in the posterior bladder. Today I discussed with the patient and her son my first recommendation would be neoadjuvant chemotherapy followed by cystectomy. On the patient's imaging she was noted to have a rather large lesion that turned out to be muscle invasive, high-grade. We had an extensive discussion of the rationale behind this treatment approach. At This time the patient continues to wish bladder preservation would like to proceed with radiosensitizing chemotherapy along with radiation treatments. After discussion today the patient however is undetermined whether she will proceed with treatment. She at this point feels she must reviewed the pathology slides documenting muscle involvement prior to proceeding with any treatment. I will place a call into Dr. Saralyn Pilar who reviewed the patient's slides to see if this could possibly be arranged for the patient.  It would be a consideration to repeat cystoscopy prior to proceeding with treatments to make sure there's been no tumor regrowth and to ensure complete resection.   PLAN: Simulation and planning pending patient's decision and review of pathology slides  I spent 60 minutes minutes face to face with the patient and more than 50% of that time was spent in counseling and/or coordination of care.   ------------------------------------------------  Blair Promise, PhD, MD

## 2014-01-03 NOTE — Progress Notes (Signed)
Please see the Nurse Progress Note in the MD Initial Consult Encounter for this patient. 

## 2014-01-05 ENCOUNTER — Telehealth: Payer: Self-pay | Admitting: Oncology

## 2014-01-05 NOTE — Telephone Encounter (Signed)
Called [employer] and left a message for her to call back.

## 2014-01-05 NOTE — Addendum Note (Signed)
Encounter addended by: Jacqulyn Liner, RN on: 01/05/2014  1:32 PM<BR>     Documentation filed: Charges VN, Visit Diagnoses

## 2014-01-08 ENCOUNTER — Encounter (HOSPITAL_COMMUNITY): Payer: Self-pay | Admitting: Pharmacy Technician

## 2014-01-08 NOTE — Telephone Encounter (Signed)
Called Sonya Banks again and advised her that she can meet with the pathologist to go over her slides per Dr. Sondra Come.  Advised her to call and talk to Sonya Banks or Sonya Banks.  Sonya Banks verbalized agreement and said she is having surgery on 01/16/14.

## 2014-01-09 ENCOUNTER — Encounter (HOSPITAL_COMMUNITY): Payer: Self-pay

## 2014-01-09 ENCOUNTER — Encounter (HOSPITAL_COMMUNITY)
Admission: RE | Admit: 2014-01-09 | Discharge: 2014-01-09 | Disposition: A | Discharge status: Home / Self Care | Payer: Medicare Other | Source: Ambulatory Visit | Attending: Urology | Admitting: Urology

## 2014-01-09 DIAGNOSIS — Z01818 Encounter for other preprocedural examination: Secondary | ICD-10-CM | POA: Insufficient documentation

## 2014-01-09 DIAGNOSIS — Z01812 Encounter for preprocedural laboratory examination: Secondary | ICD-10-CM | POA: Diagnosis not present

## 2014-01-09 HISTORY — DX: Unspecified osteoarthritis, unspecified site: M19.90

## 2014-01-09 LAB — CBC
HEMATOCRIT: 41.6 % (ref 36.0–46.0)
Hemoglobin: 13.6 g/dL (ref 12.0–15.0)
MCH: 30.6 pg (ref 26.0–34.0)
MCHC: 32.7 g/dL (ref 30.0–36.0)
MCV: 93.5 fL (ref 78.0–100.0)
Platelets: 236 10*3/uL (ref 150–400)
RBC: 4.45 MIL/uL (ref 3.87–5.11)
RDW: 12.8 % (ref 11.5–15.5)
WBC: 6.2 10*3/uL (ref 4.0–10.5)

## 2014-01-09 LAB — BASIC METABOLIC PANEL
Anion gap: 10 (ref 5–15)
BUN: 18 mg/dL (ref 6–23)
CALCIUM: 10.2 mg/dL (ref 8.4–10.5)
CO2: 28 mEq/L (ref 19–32)
Chloride: 103 mEq/L (ref 96–112)
Creatinine, Ser: 0.97 mg/dL (ref 0.50–1.10)
GFR calc Af Amer: 69 mL/min — ABNORMAL LOW (ref >90)
GFR, EST NON AFRICAN AMERICAN: 59 mL/min — AB (ref >90)
Glucose, Bld: 64 mg/dL — ABNORMAL LOW (ref 70–99)
Potassium: 4.9 mEq/L (ref 3.7–5.3)
SODIUM: 141 meq/L (ref 137–147)

## 2014-01-09 NOTE — Pre-Procedure Instructions (Signed)
EKG REPORT IN EPIC FROM 07-11-13. CXR REPORT IN EPIC FROM 11-24-13

## 2014-01-09 NOTE — Patient Instructions (Signed)
YOUR SURGERY IS SCHEDULED AT [facility]  ON:   Tuesday  8/4  REPORT TO  SHORT STAY CENTER AT:  7:00 AM   PLEASE COME IN THE [place] ENTRANCE AND FOLLOW SIGNS TO SHORT STAY CENTER.  DO NOT EAT OR DRINK ANYTHING AFTER MIDNIGHT THE NIGHT BEFORE YOUR SURGERY.  YOU MAY BRUSH YOUR TEETH, RINSE OUT YOUR MOUTH--BUT NO WATER, NO FOOD, NO CHEWING GUM, NO MINTS, NO CANDIES, NO CHEWING TOBACCO.  PLEASE TAKE THE FOLLOWING MEDICATIONS THE AM OF YOUR SURGERY WITH A FEW SIPS OF WATER:  WELLBUTRIN, FLUOXETINE   DO NOT BRING VALUABLES, MONEY, CREDIT CARDS.  DO NOT WEAR JEWELRY, MAKE-UP, NAIL POLISH AND NO METAL PINS OR CLIPS IN YOUR HAIR. CONTACT LENS, DENTURES / PARTIALS, GLASSES SHOULD NOT BE WORN TO SURGERY AND IN MOST CASES-HEARING AIDS WILL NEED TO BE REMOVED.  BRING YOUR GLASSES CASE, ANY EQUIPMENT NEEDED FOR YOUR CONTACT LENS. FOR PATIENTS ADMITTED TO THE HOSPITAL--CHECK OUT TIME THE DAY OF DISCHARGE IS 11:00 AM.  ALL INPATIENT ROOMS ARE PRIVATE - WITH BATHROOM, TELEPHONE, TELEVISION AND WIFI INTERNET.  IF YOU ARE BEING DISCHARGED THE SAME DAY OF YOUR SURGERY--YOU CAN NOT DRIVE YOURSELF HOME--AND SHOULD NOT GO HOME ALONE BY TAXI OR BUS.  NO DRIVING OR OPERATING MACHINERY, OR MAKING LEGAL DECISIONS FOR 24 HOURS FOLLOWING ANESTHESIA / PAIN MEDICATIONS.  PLEASE MAKE ARRANGEMENTS FOR SOMEONE TO BE WITH YOU AT HOME THE FIRST 24 HOURS AFTER SURGERY. RESPONSIBLE DRIVER'S NAME / PHONE  PT'S FRIEND MARGARET RYAN - [phone]                                                   PYKDXI PJAS OVER ANY  FACT SHEETS THAT YOU WERE GIVEN: MRSA INFORMATION, BLOOD TRANSFUSION INFORMATION, INCENTIVE SPIROMETER INFORMATION.  PLEASE BE AWARE THAT YOU MAY NEED ADDITIONAL BLOOD DRAWN DAY OF YOUR SURGERY  _______________________________________________________________________   [facility] - Preparing for Surgery Before surgery, you can play an important role.  Because skin is not sterile, your  skin needs to be as free of germs as possible.  You can reduce the number of germs on your skin by washing with CHG (chlorahexidine gluconate) soap before surgery.  CHG is an antiseptic cleaner which kills germs and bonds with the skin to continue killing germs even after washing. Please DO NOT use if you have an allergy to CHG or antibacterial soaps.  If your skin becomes reddened/irritated stop using the CHG and inform your nurse when you arrive at Short Stay. Do not shave (including legs and underarms) for at least 48 hours prior to the first CHG shower.  You may shave your face/neck. Please follow these instructions carefully:  1.  Shower with CHG Soap the night before surgery and the  morning of Surgery.  2.  If you choose to wash your hair, wash your hair first as usual with your  normal  shampoo.  3.  After you shampoo, rinse your hair and body thoroughly to remove the  shampoo.                           4.  Use CHG as you would any other liquid soap.  You can apply chg directly  to the skin and wash  Gently with a scrungie or clean washcloth.  5.  Apply the CHG Soap to your body ONLY FROM THE NECK DOWN.   Do not use on face/ open                           Wound or open sores. Avoid contact with eyes, ears mouth and genitals (private parts).                       Wash face,  Genitals (private parts) with your normal soap.             6.  Wash thoroughly, paying special attention to the area where your surgery  will be performed.  7.  Thoroughly rinse your body with warm water from the neck down.  8.  DO NOT shower/wash with your normal soap after using and rinsing off  the CHG Soap.                9.  Pat yourself dry with a clean towel.            10.  Wear clean pajamas.            11.  Place clean sheets on your bed the night of your first shower and do not  sleep with pets. Day of Surgery : Do not apply any lotions/deodorants the morning of surgery.  Please wear  clean clothes to the hospital/surgery center.  FAILURE TO FOLLOW THESE INSTRUCTIONS MAY RESULT IN THE CANCELLATION OF YOUR SURGERY PATIENT SIGNATURE_________________________________  NURSE SIGNATURE__________________________________  ________________________________________________________________________

## 2014-01-10 ENCOUNTER — Ambulatory Visit: Payer: Self-pay | Admitting: Internal Medicine

## 2014-01-10 NOTE — Pre-Procedure Instructions (Signed)
DR. Marcell Barlow NOTIFIED OF PT'S SURGERY DATE, TIME, AND PT REPORTED HX OF MALIGNANT HYPERTHERMIA WITH D & C SURGERY 1980.  THAT ANESTHESIA RECORD WAS NOT OBTAINED BUT HER ANESTHESIA RECORD FROM 11-14-82 HYSTERECTOMY ON CHART AND PT JUST HAD SURGERY AT [facility] 12/05/13 - THAT ANESTHESIA RECORD IN EPIC.  I HAVE PUT POSS MALIGNANT HYPERTHERMIA ON  OR SCHEDULE.  DR. Marcell Barlow STATES NO ACTION NEEDED.

## 2014-01-14 NOTE — H&P (Signed)
History of Present Illness She returns and has seen Dr. Sherral Hammers at [facility] as well as Dr. Maudie Mercury in oncology. I was able to see Dr. Julianne Rice note and he talked about neoadjuvant chemotherapy. Therefore I called Dr. Sherral Hammers and we discussed the patient. Dr. Sherral Hammers recommended cystectomy to the patient because he feels like she is healthier and could benefit the most from cystectomy.     The patient saw Dr. Alen Blew this AM he talked to her about neoadjuvant chemotherapy/cystectomy and bladder preservation with chemotherapy and radiation. He discussed the lower success rates with chemotherapy and radiation. She has decided on bladder preservation. She will see Dr. Sondra Come soon. I presented her last week at multidisciplinary tumor board and we did not have her slides to review as they were in [facility]. I was able to review the pathologist opinion from [facility] and they agreed with T2 disease.      Past Medical History Problems  1. History of Anxiety (300.00) 2. History of depression (V11.8) 3. History of esophageal reflux (V12.79) 4. History of renal calculi (V13.01)  Surgical History Problems  1. History of Cystoscopy With Fulguration Medium Lesion (2-5cm) 2. History of Hernia Repair 3. History of Hysterectomy 4. History of Umbilical Hernia Repair  Current Meds 1. EpiPen DEVI;  Therapy: (Recorded:28May2015) to Recorded 2. Fish Oil CAPS;  Therapy: (Recorded:28May2015) to Recorded 3. Flaxseed Oil CAPS;  Therapy: (Recorded:28May2015) to Recorded 4. FLUoxetine HCl CAPS;  Therapy: (Recorded:28May2015) to Recorded 5. Green Tea CAPS;  Therapy: (Recorded:14Jul2015) to Recorded 6. LORazepam TABS;  Therapy: (Recorded:28May2015) to Recorded 7. Magnesium CAPS;  Therapy: (Recorded:06Jul2015) to Recorded 8. Meloxicam TABS;  Therapy: (Recorded:28May2015) to Recorded 9. Multi Vitamin Daily TABS;  Therapy: (Recorded:28May2015) to Recorded 10. Uribel 118 MG Oral Capsule; 1 po q6 prn;   Therapy: [id number] to  (Last Rx:03Jul2015)  Requested for: [id number] Ordered 11. Verapamil HCl TABS;   Therapy: (Recorded:28May2015) to Recorded 12. Vitamin D TABS;   Therapy: (Recorded:28May2015) to Recorded 13. Wellbutrin XL 150 MG Oral Tablet Extended Release 24 Hour;   Therapy: (Recorded:06Jul2015) to Recorded  Allergies Medication  1. Sulfa Drugs Non-Medication  2. Bee sting  Family History Problems  1. Family history of Aneurysm 2. Family history of Deceased : Daughter 3. Family history of polycystic kidney disease (V18.61) 4. Family history of renal failure (V18.69) : Sibling 5. Family history of tuberculosis (V18.8) : Aunt  Social History Problems  1. Caffeine use (V49.89) 2. Divorced 3. Number of children 4. Occupation 5. Smokes tobacco daily (305.1) 6. Social alcohol use  Vitals Vital Signs [Data Includes: Last 1 Day]  Recorded: [id number] 02:39PM  Blood Pressure: 104 / 70 Temperature: 97.1 F Heart Rate: 75  Results/Data  26 Dec 2013 2:27 PM  UA With REFLEX    COLOR YELLOW     APPEARANCE CLEAR     SPECIFIC GRAVITY 1.020     pH 6.0     GLUCOSE NEG     BILIRUBIN NEG     KETONE NEG     BLOOD NEG     PROTEIN NEG     UROBILINOGEN 1     NITRITE NEG     LEUKOCYTE ESTERASE SMALL     SQUAMOUS EPITHELIAL/HPF NONE SEEN     WBC 3-6     CRYSTALS NONE SEEN     CASTS NONE SEEN     RBC NONE SEEN     BACTERIA NONE SEEN   Old records or history reviewed: Epic notes Dr Alen Blew, Care Everywhere Dr  Kim.    Assessment Assessed  1. 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 Follow-up Month x 3 Office  Follow-up  Status: Hold For - Date of Service  Requested for:  14Oct2015  Discussion/Summary She's chosen bladder preservation. I do feel like I got a maximal TURBT, but given that she has chosen bladder preservation I don't think it is a bad idea to go back in and re-resect the area as a Soil scientist. The more I thought about it I called the patient back and we discussed repeat TURBT and she elected to proceed.     We also again discussed the nature risk and benefits of bladder preservation versus cystectomy and how bladder preservation might affect bladder function as well as risk of failure to cure the cancer and/or recurrence. She understands if any of these situations were to arise she may need cystectomy and that cystectomy could be more difficult after radiation. She is with her son today. She is pleased with her choice for bladder preservation.    I will set up for TURBT prior to starting chemotherapy and radiation. Also we need to consider whether we reassessed the tumor after 40-45 gray before she gets full dose of radiation. If she had residual tumor cystectomy is preferred by NCCN guidelines.    I sent Dr. Sondra Come and Dr. Alen Blew a note through [place]. I personally called Dr. Sherral Hammers. I spoke at length with the patient     Signatures Electronically signed by : Festus Aloe, M.D.; Dec 26 2013  5:39PM EST

## 2014-01-15 ENCOUNTER — Encounter: Payer: Self-pay | Admitting: *Deleted

## 2014-01-15 NOTE — Progress Notes (Signed)
[place] Psychosocial Distress Screening Clinical Social Work  Clinical Social Work was referred by distress screening protocol.  The patient scored a 10 on the Psychosocial Distress Thermometer which indicates severe distress. Clinical Social Worker contacted patient by phone to assess for distress and other psychosocial needs.  Ms. Hamill reports she will be meeting with her surgeon tomorrow and is eager/anxious to begin radiation and chemotherapy treatment.  The patient shared she no longer has concerns regarding insurance- she signed up for a supplemental plan through the healthcare exchange.  She did express difficulty paying for groceries, medications, etc- CSW encouraged patient to meet with financial advocates to determine if she qualifies for [employer].  She shared she has had some issues with loss of appetite and constipation- CSW encouraged patient to meet with dietitian.   CSW briefly shared information on support programs/services and encouraged patient to contact CSW if she continues to experience anxiety once she begins treatment.  ONCBCN DISTRESS SCREENING 01/03/2014  Screening Type Initial Screening  Elta [place] the number that describes how much distress you have been experiencing in the past week 10  Practical problem type Insurance  Emotional problem type Nervousness/Anxiety;Adjusting to illness;Isolation/feeling alone;Depression  Information Concerns Type Lack of info about diagnosis;Lack of info about treatment;Lack of info about complementary therapy choices  Physical Problem type Loss of appetitie;Constipation/diarrhea  Physician notified of physical symptoms Yes    Clinical Social Worker follow up needed: No.  If yes, follow up plan:   Polo Riley, MSW, LCSW, OSW-C Clinical Social Worker [facility] [phone]

## 2014-01-16 ENCOUNTER — Encounter (HOSPITAL_COMMUNITY): Payer: Self-pay | Admitting: Certified Registered"

## 2014-01-16 ENCOUNTER — Ambulatory Visit (HOSPITAL_COMMUNITY)
Admission: RE | Admit: 2014-01-16 | Discharge: 2014-01-16 | Disposition: A | Discharge status: Home / Self Care | Payer: Medicare HMO | Source: Ambulatory Visit | Attending: Urology | Admitting: Urology

## 2014-01-16 ENCOUNTER — Encounter (HOSPITAL_COMMUNITY): Payer: Medicare HMO | Admitting: Certified Registered"

## 2014-01-16 ENCOUNTER — Ambulatory Visit (HOSPITAL_COMMUNITY): Payer: Medicare HMO | Admitting: Certified Registered"

## 2014-01-16 ENCOUNTER — Encounter (HOSPITAL_COMMUNITY)
Admission: RE | Disposition: A | Discharge status: Home / Self Care | Payer: Self-pay | Source: Ambulatory Visit | Attending: Urology

## 2014-01-16 DIAGNOSIS — Z87442 Personal history of urinary calculi: Secondary | ICD-10-CM | POA: Diagnosis not present

## 2014-01-16 DIAGNOSIS — F411 Generalized anxiety disorder: Secondary | ICD-10-CM | POA: Insufficient documentation

## 2014-01-16 DIAGNOSIS — F329 Major depressive disorder, single episode, unspecified: Secondary | ICD-10-CM | POA: Insufficient documentation

## 2014-01-16 DIAGNOSIS — F17200 Nicotine dependence, unspecified, uncomplicated: Secondary | ICD-10-CM | POA: Diagnosis not present

## 2014-01-16 DIAGNOSIS — K219 Gastro-esophageal reflux disease without esophagitis: Secondary | ICD-10-CM | POA: Diagnosis not present

## 2014-01-16 DIAGNOSIS — F3289 Other specified depressive episodes: Secondary | ICD-10-CM | POA: Insufficient documentation

## 2014-01-16 DIAGNOSIS — C679 Malignant neoplasm of bladder, unspecified: Secondary | ICD-10-CM | POA: Insufficient documentation

## 2014-01-16 HISTORY — PX: TRANSURETHRAL RESECTION OF BLADDER TUMOR: SHX2575

## 2014-01-16 SURGERY — TURBT (TRANSURETHRAL RESECTION OF BLADDER TUMOR)
Anesthesia: Monitor Anesthesia Care | Site: Bladder

## 2014-01-16 MED ORDER — PROPOFOL 10 MG/ML IV BOLUS
INTRAVENOUS | Status: AC
  Filled 2014-01-16: qty 20

## 2014-01-16 MED ORDER — LACTATED RINGERS IV SOLN
INTRAVENOUS | Status: DC | PRN
  Administered 2014-01-16: 09:00:00 via INTRAVENOUS

## 2014-01-16 MED ORDER — PROPOFOL INFUSION 10 MG/ML OPTIME
INTRAVENOUS | Status: DC | PRN
  Administered 2014-01-16: 140 ug/kg/min via INTRAVENOUS

## 2014-01-16 MED ORDER — FENTANYL CITRATE 0.05 MG/ML IJ SOLN: 25.0000 ug | INTRAMUSCULAR | Status: DC | PRN

## 2014-01-16 MED ORDER — ONDANSETRON HCL 4 MG/2ML IJ SOLN
INTRAMUSCULAR | Status: AC
  Filled 2014-01-16: qty 2

## 2014-01-16 MED ORDER — MIDAZOLAM HCL 5 MG/5ML IJ SOLN
INTRAMUSCULAR | Status: DC | PRN
  Administered 2014-01-16: 2 mg via INTRAVENOUS

## 2014-01-16 MED ORDER — FENTANYL CITRATE 0.05 MG/ML IJ SOLN
INTRAMUSCULAR | Status: DC | PRN
  Administered 2014-01-16 (×2): 50 ug via INTRAVENOUS

## 2014-01-16 MED ORDER — ONDANSETRON HCL 4 MG/2ML IJ SOLN
INTRAMUSCULAR | Status: DC | PRN
  Administered 2014-01-16: 4 mg via INTRAVENOUS

## 2014-01-16 MED ORDER — LACTATED RINGERS IV SOLN: INTRAVENOUS | Status: DC

## 2014-01-16 MED ORDER — LIDOCAINE HCL 2 % EX GEL
CUTANEOUS | Status: DC | PRN
  Administered 2014-01-16: 1

## 2014-01-16 MED ORDER — CEFAZOLIN SODIUM-DEXTROSE 2-3 GM-% IV SOLR
INTRAVENOUS | Status: AC
  Filled 2014-01-16: qty 50

## 2014-01-16 MED ORDER — STERILE WATER FOR IRRIGATION IR SOLN
Status: DC | PRN
  Administered 2014-01-16: 3000 mL

## 2014-01-16 MED ORDER — CEFAZOLIN SODIUM-DEXTROSE 2-3 GM-% IV SOLR
2.0000 g | INTRAVENOUS | Status: AC
Start: 2014-01-16 — End: 2014-01-16
  Administered 2014-01-16: 2 g via INTRAVENOUS

## 2014-01-16 MED ORDER — MIDAZOLAM HCL 2 MG/2ML IJ SOLN
INTRAMUSCULAR | Status: AC
  Filled 2014-01-16: qty 2

## 2014-01-16 MED ORDER — LIDOCAINE HCL (CARDIAC) 20 MG/ML IV SOLN
INTRAVENOUS | Status: AC
  Filled 2014-01-16: qty 5

## 2014-01-16 MED ORDER — PROPOFOL 10 MG/ML IV BOLUS
INTRAVENOUS | Status: DC | PRN
  Administered 2014-01-16: 50 mg via INTRAVENOUS

## 2014-01-16 MED ORDER — LIDOCAINE HCL 2 % EX GEL
CUTANEOUS | Status: AC
  Filled 2014-01-16: qty 10

## 2014-01-16 MED ORDER — FENTANYL CITRATE 0.05 MG/ML IJ SOLN
INTRAMUSCULAR | Status: AC
  Filled 2014-01-16: qty 2

## 2014-01-16 MED ORDER — SODIUM CHLORIDE 0.9 % IR SOLN
Status: DC | PRN
  Administered 2014-01-16: 3000 mL

## 2014-01-16 MED ORDER — BELLADONNA ALKALOIDS-OPIUM 16.2-60 MG RE SUPP
RECTAL | Status: AC
  Filled 2014-01-16: qty 1

## 2014-01-16 SURGICAL SUPPLY — 20 items
BAG URINE DRAINAGE (UROLOGICAL SUPPLIES) IMPLANT
BAG URO CATCHER STRL LF (DRAPE) ×2 IMPLANT
DRAPE CAMERA CLOSED 9X96 (DRAPES) ×2 IMPLANT
ELECT BUTTON HF 24-28F 2 30DE (ELECTRODE) ×1 IMPLANT
ELECT LOOP MED HF 24F 12D (CUTTING LOOP) ×1 IMPLANT
ELECT LOOP MED HF 24F 12D CBL (CLIP) ×1 IMPLANT
ELECT REM PT RETURN 9FT ADLT (ELECTROSURGICAL) ×2
ELECT RESECT VAPORIZE 12D CBL (ELECTRODE) IMPLANT
ELECTRODE REM PT RTRN 9FT ADLT (ELECTROSURGICAL) IMPLANT
GLOVE BIOGEL M STRL SZ7.5 (GLOVE) ×1 IMPLANT
GLOVE BIOGEL PI IND STRL 7.5 (GLOVE) IMPLANT
GLOVE BIOGEL PI INDICATOR 7.5 (GLOVE) ×1
GLOVE INDICATOR 6.5 STRL GRN (GLOVE) ×2 IMPLANT
GOWN STRL REUS W/TWL XL LVL3 (GOWN DISPOSABLE) ×3 IMPLANT
IV NS IRRIG 3000ML ARTHROMATIC (IV SOLUTION) ×1 IMPLANT
KIT ASPIRATION TUBING (SET/KITS/TRAYS/PACK) IMPLANT
MANIFOLD NEPTUNE II (INSTRUMENTS) ×2 IMPLANT
PACK CYSTO (CUSTOM PROCEDURE TRAY) ×2 IMPLANT
TUBING CONNECTING 10 (TUBING) ×2 IMPLANT
WATER STERILE IRR 3000ML UROMA (IV SOLUTION) ×1 IMPLANT

## 2014-01-16 NOTE — Anesthesia Postprocedure Evaluation (Signed)
  Anesthesia Post-op Note  Patient: Sonya Banks  Procedure(s) Performed: Procedure(s): exam under anesthesia, cystoscopy, bladder tumor biopsies and fulgeration (N/A)  Patient Location: PACU  Anesthesia Type:MAC  Level of Consciousness: awake, alert  and oriented  Airway and Oxygen Therapy: Patient Spontanous Breathing  Post-op Pain: none  Post-op Assessment: Post-op Vital signs reviewed  Post-op Vital Signs: Reviewed  Last Vitals:  Filed Vitals:   01/16/14 1127  BP: 114/86  Pulse: 62  Temp: 36.4 C  Resp: 16    Complications: No apparent anesthesia complications

## 2014-01-16 NOTE — Anesthesia Preprocedure Evaluation (Addendum)
Anesthesia Evaluation  Patient identified by MRN, date of birth, ID band Patient awake    Reviewed: Allergy & Precautions, H&P , NPO status , Patient's Chart, lab work & pertinent test results  History of Anesthesia Complications (+) MALIGNANT HYPERTHERMIA, PSEUDOCHOLINESTERASE DEFICIENCY and history of anesthetic complications (? h/o MH vs pseudocholinesterase deficiency? Old records sent for.)  Airway Mallampati: II TM Distance: >3 FB Neck ROM: Full    Dental no notable dental hx. (+) Teeth Intact, Dental Advisory Given   Pulmonary COPDCurrent Smoker,  breath sounds clear to auscultation  Pulmonary exam normal       Cardiovascular hypertension, negative cardio ROS  Rhythm:Regular Rate:Normal     Neuro/Psych negative neurological ROS  negative psych ROS   GI/Hepatic negative GI ROS, Neg liver ROS,   Endo/Other  negative endocrine ROS  Renal/GU negative Renal ROS  negative genitourinary   Musculoskeletal  (+) Fibromyalgia -  Abdominal (+) + obese,   Peds negative pediatric ROS (+)  Hematology negative hematology ROS (+)   Anesthesia Other Findings   Reproductive/Obstetrics negative OB ROS                        Anesthesia Physical Anesthesia Plan  ASA: III  Anesthesia Plan: MAC   Post-op Pain Management:    Induction:   Airway Management Planned: Simple Face Mask  Additional Equipment:   Intra-op Plan:   Post-operative Plan:   Informed Consent:   Plan Discussed with: Surgeon  Anesthesia Plan Comments:        Anesthesia Quick Evaluation

## 2014-01-16 NOTE — Transfer of Care (Addendum)
Immediate Anesthesia Transfer of Care Note  Patient: Sonya Banks  Procedure(s) Performed: Procedure(s): exam under anesthesia, cystoscopy, bladder tumor biopsies and fulgeration (N/A)  Patient Location: PACU  Anesthesia Type:MAC  Level of Consciousness: awake, alert , oriented and patient cooperative  Airway & Oxygen Therapy: Patient Spontanous Breathing and Patient connected to face mask oxygen  Post-op Assessment:report given to PACU nurse , vitalssigns stable   Complications: No apparent anesthesia complications

## 2014-01-16 NOTE — Discharge Instructions (Signed)
Cystoscopy, Care After   Refer to this sheet in the next few weeks. These instructions provide you with information on caring for yourself after your procedure. Your caregiver may also give you more specific instructions. Your treatment has been planned according to current medical practices, but problems sometimes occur. Call your caregiver if you have any problems or questions after your procedure.   HOME CARE INSTRUCTIONS   Things you can do to ease any discomfort after your procedure include:   Drinking enough water and fluids to keep your urine clear or pale yellow.   Taking a warm bath to relieve any burning feelings.  SEEK IMMEDIATE MEDICAL CARE IF:   You have an increase in blood in your urine.   You notice blood clots in your urine.   You have difficulty passing urine.   You have the chills.   You have abdominal pain.   You have a fever or persistent symptoms for more than 2-3 days.   You have a fever and your symptoms suddenly get worse.  MAKE SURE YOU:   Understand these instructions.   Will watch your condition.   Will get help right away if you are not doing well or get worse.  Document Released: 12/19/2004 Document Revised: 02/01/2013 Document Reviewed: 11/23/2011   ExitCare® Patient Information ©2015 [employer]. This information is not intended to replace advice given to you by your health care provider. Make sure you discuss any questions you have with your health care provider.

## 2014-01-16 NOTE — Interval H&P Note (Signed)
History and Physical Interval Note:  01/16/2014 9:08 AM  Sonya Banks  has presented today for surgery, with the diagnosis of BLADDER CANCER  The various methods of treatment have been discussed with the patient and family. After consideration of risks, benefits and other options for treatment, the patient has consented to  Procedure(s): TRANSURETHRAL RESECTION OF BLADDER TUMOR (TURBT) (N/A) as a surgical intervention .  The patient's history has been reviewed, patient examined, no change in status, stable for surgery.  I have reviewed the patient's chart and labs.  Questions were answered to the patient's satisfaction.  Pt has been well and persistent in her desire for bladder preservation. She's been well without dysuria, fever, hematuria. We discussed exam under anesthesia, cystoscopy, poss bbx, poss TURBT depending on findings. I felt she got a near max TURBT the first time, but we discussed as a matter of standard one final look prior to chemo/XRT. Discussed risks of bladder perforation and prolonged foley catheter among other risks.     Festus Aloe

## 2014-01-16 NOTE — Op Note (Signed)
Preoperative diagnosis: Bladder cancer Postoperative diagnosis: Bladder cancer  Procedure: Exam under anesthesia Cystoscopy with bladder biopsy and fulguration  Surgeon: Junious Silk  Anesthesia: [place]  Type of anesthesia: Gen.  Findings: On exam under anesthesia the abdomen and bladder were palpably normal on bimanual exam. There were no masses palpated.  On cystoscopy the resection was near-complete but just a small area of abnormal mucosa at the 1:00 position of the prior resection site. The site had retracted and healed well with a remaining small amount of granulation tissue. There was nothing further to resect. 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  Description of procedure: After consent was obtained patient brought to the operating room. After adequate anesthesia she is placed in lithotomy position and prepped and draped in the usual sterile fashion. An exam under anesthesia was performed. A cystoscope was passed per urethra and the bladder examined. Findings as above. The cold cup biopsy forceps were deployed and a biopsy be tumor bed and a after clearing off the granulation tissue. Next the biopsy the abnormal mucosa at the 1:00 position of the tumor site. Timeout biopsied superior to the tumor site in the area of the prior CIS. All 3 sites were fulgurated. Hemostasis was excellent. The bladder was drained and the scope removed. The urethra was infiltrated with lidocaine.  She was awakened taken to recovery room in stable condition.  Blood loss: Minimal Complications: None Drains: None  Specimens: #1 bladder tumor base and edge #2 bladder tumor - abnormal mucosa #3 superior to bladder tumor All specimens to pathology

## 2014-01-17 ENCOUNTER — Encounter (HOSPITAL_COMMUNITY): Payer: Self-pay | Admitting: Urology

## 2014-01-18 ENCOUNTER — Ambulatory Visit (HOSPITAL_BASED_OUTPATIENT_CLINIC_OR_DEPARTMENT_OTHER): Payer: Medicare HMO | Admitting: Oncology

## 2014-01-18 ENCOUNTER — Other Ambulatory Visit: Payer: Self-pay | Admitting: Internal Medicine

## 2014-01-18 ENCOUNTER — Other Ambulatory Visit (HOSPITAL_BASED_OUTPATIENT_CLINIC_OR_DEPARTMENT_OTHER): Payer: Medicare HMO

## 2014-01-18 VITALS — BP 138/78 | HR 74 | Temp 98.3°F | Resp 18 | Ht 67.0 in | Wt 131.7 lb

## 2014-01-18 DIAGNOSIS — C671 Malignant neoplasm of dome of bladder: Secondary | ICD-10-CM

## 2014-01-18 DIAGNOSIS — C679 Malignant neoplasm of bladder, unspecified: Secondary | ICD-10-CM

## 2014-01-18 DIAGNOSIS — C670 Malignant neoplasm of trigone of bladder: Secondary | ICD-10-CM

## 2014-01-18 DIAGNOSIS — K5900 Constipation, unspecified: Secondary | ICD-10-CM

## 2014-01-18 LAB — COMPREHENSIVE METABOLIC PANEL (CC13)
ALBUMIN: 3.6 g/dL (ref 3.5–5.0)
ALT: 28 U/L (ref 0–55)
AST: 41 U/L — ABNORMAL HIGH (ref 5–34)
Alkaline Phosphatase: 90 U/L (ref 40–150)
Anion Gap: 7 mEq/L (ref 3–11)
BUN: 12.2 mg/dL (ref 7.0–26.0)
CO2: 28 mEq/L (ref 22–29)
Calcium: 9.7 mg/dL (ref 8.4–10.4)
Chloride: 107 mEq/L (ref 98–109)
Creatinine: 0.8 mg/dL (ref 0.6–1.1)
GLUCOSE: 104 mg/dL (ref 70–140)
POTASSIUM: 4.3 meq/L (ref 3.5–5.1)
Sodium: 141 mEq/L (ref 136–145)
TOTAL PROTEIN: 7.2 g/dL (ref 6.4–8.3)
Total Bilirubin: 0.47 mg/dL (ref 0.20–1.20)

## 2014-01-18 LAB — CBC WITH DIFFERENTIAL/PLATELET
BASO%: 0.9 % (ref 0.0–2.0)
Basophils Absolute: 0 10*3/uL (ref 0.0–0.1)
EOS ABS: 0.1 10*3/uL (ref 0.0–0.5)
EOS%: 2.3 % (ref 0.0–7.0)
HCT: 41.2 % (ref 34.8–46.6)
HGB: 13.4 g/dL (ref 11.6–15.9)
LYMPH%: 44.1 % (ref 14.0–49.7)
MCH: 30 pg (ref 25.1–34.0)
MCHC: 32.4 g/dL (ref 31.5–36.0)
MCV: 92.7 fL (ref 79.5–101.0)
MONO#: 0.4 10*3/uL (ref 0.1–0.9)
MONO%: 7.1 % (ref 0.0–14.0)
NEUT%: 45.6 % (ref 38.4–76.8)
NEUTROS ABS: 2.4 10*3/uL (ref 1.5–6.5)
Platelets: 222 10*3/uL (ref 145–400)
RBC: 4.45 10*6/uL (ref 3.70–5.45)
RDW: 13.2 % (ref 11.2–14.5)
WBC: 5.2 10*3/uL (ref 3.9–10.3)
lymph#: 2.3 10*3/uL (ref 0.9–3.3)

## 2014-01-18 NOTE — Progress Notes (Signed)
Hematology and Oncology Follow Up Visit  Sonya Banks [id number] 1945/10/07 68 y.o. 01/18/2014 12:01 PM Sonya Banks, MDMcKeown, [name], MD   Principle Diagnosis: 68 year old woman diagnosed with muscle invasive bladder cancer in June of 2015 with clinical staging of T2 N0.    Prior Therapy: She is status post TURBT on June 20 13,015 without any residual tumors. She is status post repeat cystoscopy and biopsies obtained on 01/16/2014.  Current therapy: She is under evaluation to start definitive radiation therapy concomitantly with chemotherapy in the form of carboplatin weekly.  Interim History:  [name] presents today for a followup visit. Since her last visit, she underwent a repeat cystoscopy under the care of Dr. Junious Silk without any complications. There was no visible tumors at that time and biopsies were obtained. She is not reporting any abdominal pain or discomfort. Is not reporting any hematuria or dysuria. She is reporting constipation for the most part feels well. She does not report any headaches or blurry vision or double vision. Is that report any syncope or seizures. She does not report any chest pain or palpitation orthopnea. Does not report any shortness of breath or cough. Is not reporting any nausea or vomiting or abdominal pain. She does not report any frequency urgency or hesitancy. His (nephrologist myalgias. Mr. review of systems unremarkable.  Medications: I have reviewed the patient's current medications.  Current Outpatient Prescriptions  Medication Sig Dispense Refill  . aspirin EC 81 MG tablet Take 81 mg by mouth daily.      Marland Kitchen buPROPion (WELLBUTRIN XL) 300 MG 24 hr tablet Take 300 mg by mouth every morning.      . Cholecalciferol (VITAMIN D PO) Take 5,000 Units by mouth daily.      Marland Kitchen EPINEPHrine (EPIPEN) 0.3 mg/0.3 mL IJ SOAJ injection Inject 0.3 mg into the muscle once as needed (Bee Sting).      . Flaxseed, Linseed, (FLAXSEED OIL) 1000 MG CAPS  Take 1,000 mg by mouth daily.       Marland Kitchen FLUoxetine (PROZAC) 20 MG capsule Take 20 mg by mouth every morning.       Nyoka Cowden Tea, Camillia sinensis, (GREEN TEA EXTRACT PO) Take 1 tablet by mouth daily.       Marland Kitchen LORazepam (ATIVAN) 2 MG tablet Take 1 mg by mouth at bedtime as needed for anxiety.       . Magnesium 250 MG TABS Take 250 mg by mouth daily.       . meloxicam (MOBIC) 7.5 MG tablet Take 7.5 mg by mouth daily.      . Multiple Vitamin (MULTIVITAMIN WITH MINERALS) TABS tablet Take 1 tablet by mouth daily.      . Omega-3 Fatty Acids (FISH OIL) 1000 MG CPDR Take 1,000 mg by mouth daily.       Marland Kitchen OVER THE COUNTER MEDICATION Take 1 tablet by mouth daily. Broccoli & Kale powder      . Probiotic Product (PROBIOTIC DAILY PO) Take 1 tablet by mouth daily.      .        . verapamil (CALAN-SR) 240 MG CR tablet Take 120 mg by mouth at bedtime as needed (when she is having cluster headaches).       No current facility-administered medications for this visit.     Allergies:  Allergies  Allergen Reactions  . Bee Venom Anaphylaxis  . Sulfa Antibiotics Swelling, Anaphylaxis, Hives and Shortness Of Breath     Starts Internally, hard to breath , with large  patches of hives.  . Ibuprofen Other (See Comments)    Patient's family has a history of kidney problems and MD stated not to take Ibuprofen.  . Latex     WHEN PT WEARS LATEX GLOVES - HANDS START ITCHING  . Other     PT REPORTS HX OF MALIGNANT HYPERTHERMIA WITH D&C 1980 -  ? MALIGNANT HYPERTHERMIA VS PSEUDOCHOLINESTERASE DEFICIENCY?  Marland Kitchen Prednisone     Pain/ broken blood vessels with high dose    Past Medical History, Surgical history, Social history, and Family History were reviewed and updated.   Physical Exam: Blood pressure 138/78, pulse 74, temperature 98.3 F (36.8 C), temperature source Oral, resp. rate 18, height 5\' 7"  (1.702 m), weight 131 lb 11.2 oz (59.739 kg), SpO2 99.00%. ECOG: 0 General appearance: alert and cooperative Head:  Normocephalic, without obvious abnormality Neck: no adenopathy Lymph nodes: Cervical, supraclavicular, and axillary nodes normal. Heart:regular rate and rhythm, S1, S2 normal, no murmur, click, rub or gallop Lung:chest clear, no wheezing, rales, normal symmetric air entry, Heart exam - S1, S2 normal, no murmur, no gallop, rate regular Abdomin: soft, non-tender, without masses or organomegaly EXT:no erythema, induration, or nodules   Lab Results: Lab Results  Component Value Date   WBC 5.2 01/18/2014   HGB 13.4 01/18/2014   HCT 41.2 01/18/2014   MCV 92.7 01/18/2014   PLT 222 01/18/2014     Chemistry      Component Value Date/Time   NA 141 01/09/2014 1330   K 4.9 01/09/2014 1330   CL 103 01/09/2014 1330   CO2 28 01/09/2014 1330   BUN 18 01/09/2014 1330   CREATININE 0.97 01/09/2014 1330   CREATININE 0.77 07/11/2013 1652      Component Value Date/Time   CALCIUM 10.2 01/09/2014 1330   ALKPHOS 84 07/11/2013 1652   AST 21 07/11/2013 1652   ALT 11 07/11/2013 1652   BILITOT 0.4 07/11/2013 1652       Impression and Plan:  68 year old woman with the following issues:  1. Muscle invasive bladder cancer presented with T2 N0 disease without any evidence of metastasis. Patient refused definitive surgery at this time and opted to proceed with radiation therapy concomitantly with chemotherapy. I discussed with her again the logistics of administration of weekly carboplatin associated with radiation at this time. Complications include nausea, vomiting, myelosuppression, renal and nodal toxicities. She attended chemotherapy education class and willing to proceed. We will schedule her first treatment in the first week of radiation.  2. IV access: We will use her peripheral veins for the time being.  3. Antiemetics: She will be prescribed Compazine prior to her chemotherapy start.  4. Constipation: I have advised her to use MiraLAX daily to assist in her symptoms.  [facility], MD 8/6/201512:01 PM

## 2014-01-23 ENCOUNTER — Other Ambulatory Visit: Payer: Self-pay | Admitting: *Deleted

## 2014-01-24 ENCOUNTER — Ambulatory Visit: Payer: Self-pay | Admitting: Internal Medicine

## 2014-01-29 ENCOUNTER — Other Ambulatory Visit: Payer: Self-pay | Admitting: Urology

## 2014-01-31 ENCOUNTER — Ambulatory Visit
Admission: RE | Admit: 2014-01-31 | Discharge: 2014-01-31 | Disposition: A | Discharge status: Home / Self Care | Payer: Medicare Other | Source: Ambulatory Visit | Attending: Radiation Oncology | Admitting: Radiation Oncology

## 2014-01-31 VITALS — BP 139/88 | HR 70 | Temp 98.2°F | Ht 67.0 in | Wt 132.3 lb

## 2014-01-31 DIAGNOSIS — Z791 Long term (current) use of non-steroidal anti-inflammatories (NSAID): Secondary | ICD-10-CM | POA: Diagnosis not present

## 2014-01-31 DIAGNOSIS — F3289 Other specified depressive episodes: Secondary | ICD-10-CM | POA: Diagnosis not present

## 2014-01-31 DIAGNOSIS — Z7982 Long term (current) use of aspirin: Secondary | ICD-10-CM | POA: Diagnosis not present

## 2014-01-31 DIAGNOSIS — C679 Malignant neoplasm of bladder, unspecified: Secondary | ICD-10-CM

## 2014-01-31 DIAGNOSIS — J449 Chronic obstructive pulmonary disease, unspecified: Secondary | ICD-10-CM | POA: Diagnosis not present

## 2014-01-31 DIAGNOSIS — Z510 Encounter for antineoplastic radiation therapy: Secondary | ICD-10-CM | POA: Diagnosis not present

## 2014-01-31 DIAGNOSIS — F329 Major depressive disorder, single episode, unspecified: Secondary | ICD-10-CM | POA: Diagnosis not present

## 2014-01-31 DIAGNOSIS — G44009 Cluster headache syndrome, unspecified, not intractable: Secondary | ICD-10-CM | POA: Diagnosis not present

## 2014-01-31 DIAGNOSIS — F17200 Nicotine dependence, unspecified, uncomplicated: Secondary | ICD-10-CM | POA: Diagnosis not present

## 2014-01-31 MED ORDER — SODIUM CHLORIDE 0.9 % IJ SOLN
10.0000 mL | Freq: Once | INTRAMUSCULAR | Status: AC
  Administered 2014-01-31: 10 mL via INTRAVENOUS

## 2014-01-31 NOTE — Progress Notes (Signed)
Sonya Banks is here for an IV start for CT SIM.  She denies pain.  She denies taking metformin or having any problems when taking IV contrast dye.  A 22 gauge IV was inserted in her right ac.  Blood return noted and IV was flushed with 10 cc normal saline.  IV was secured with tape and covered with Tegaderm.  Patient tolerated well.

## 2014-01-31 NOTE — Progress Notes (Signed)
Removed right AC 22 gauge IV. Catheter intact upon removal. Applied an occlusive dressing to the site. Escorted patient to elevator. Patient verbalized understanding of when to return for treatment. Patient discharged to home.

## 2014-02-01 ENCOUNTER — Telehealth: Payer: Self-pay | Admitting: Oncology

## 2014-02-01 NOTE — Telephone Encounter (Signed)
Sonya Banks called and was wondering if the IV dye from yesterday would make her urine turn colors.  She said her urine was dark this morning.  Asked her if she had drank any fluids this morning and she said no.  Advised her that her urine is concentrated because she had not had any fluids yet today.  Advised her to drink a lot of water today and her urine will be lighter.  Sonya Banks verbalized understanding.

## 2014-02-02 NOTE — Progress Notes (Signed)
  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  Identity was confirmed.  All relevant records and images related to the planned course of therapy were reviewed.  The patient freely provided informed written consent to proceed with treatment after reviewing the details related to the planned course of therapy. The consent form was witnessed and verified by the simulation staff.  Then, the patient was set-up in a stable reproducible  supine position for radiation therapy.  CT images were obtained.  Surface markings were placed.  The CT images were loaded into the planning software.  Then the target and avoidance structures were contoured.  Treatment planning then occurred.  The radiation prescription was entered and confirmed.  Then, I designed and supervised the construction of a total of 1 medically necessary complex treatment devices.  I have requested : Intensity Modulated Radiotherapy (IMRT) is medically necessary for this case for the following reason:  Small bowel sparing..  I have ordered:dose calc.  PLAN:  The patient will receive 45 Gy in 25 fractions. After 25 treatments the patient will be reevaluated by urology with cystoscopy. If the patient has no visible tumor within the bladder then she will proceed with a definitive dose of radiation therapy to approximately 64 Gy.  The patient is scheduled to begin her radiation therapy on August 31  ________________________________  Special treatment procedure note  The patient will be receiving radiosensitizing chemotherapy during her pelvic radiation therapy. Given the increased potential for toxicities as well as the  necessity for close monitoring of the patient and bloodwork, this constitutes a special treatment procedure -----------------------------------  Blair Promise, PhD, MD

## 2014-02-05 ENCOUNTER — Other Ambulatory Visit: Payer: Self-pay | Admitting: Oncology

## 2014-02-05 DIAGNOSIS — C679 Malignant neoplasm of bladder, unspecified: Secondary | ICD-10-CM

## 2014-02-06 DIAGNOSIS — Z510 Encounter for antineoplastic radiation therapy: Secondary | ICD-10-CM | POA: Diagnosis not present

## 2014-02-08 ENCOUNTER — Telehealth: Payer: Self-pay | Admitting: Oncology

## 2014-02-08 NOTE — Telephone Encounter (Signed)
Lft msg for pt labs/ov per 08/24 POF, sent msg to add chemo, mailed AVS....KJ

## 2014-02-12 ENCOUNTER — Ambulatory Visit
Admission: RE | Admit: 2014-02-12 | Discharge: 2014-02-12 | Disposition: A | Discharge status: Home / Self Care | Payer: Medicare Other | Source: Ambulatory Visit | Attending: Radiation Oncology | Admitting: Radiation Oncology

## 2014-02-12 DIAGNOSIS — C674 Malignant neoplasm of posterior wall of bladder: Secondary | ICD-10-CM

## 2014-02-12 DIAGNOSIS — Z510 Encounter for antineoplastic radiation therapy: Secondary | ICD-10-CM | POA: Diagnosis not present

## 2014-02-12 NOTE — Progress Notes (Signed)
Sonya Banks was given the Radiation Therapy and You book and discussed potential side effects/management of fatigue, diarrhea, bladder changes, skin changes and nausea.  She was given a sitz bath and was instructed on how to use it.  She returned demonstration on how to use the sitz bath. She was educated about under treat day with Dr. Sondra Come on Tuesday's.  She was advised to call with any questions and concerns.

## 2014-02-13 ENCOUNTER — Ambulatory Visit
Admission: RE | Admit: 2014-02-13 | Discharge: 2014-02-13 | Disposition: A | Discharge status: Home / Self Care | Payer: Medicare Other | Source: Ambulatory Visit | Attending: Radiation Oncology | Admitting: Radiation Oncology

## 2014-02-13 ENCOUNTER — Encounter: Payer: Self-pay | Admitting: Radiation Oncology

## 2014-02-13 VITALS — BP 128/86 | HR 76 | Temp 98.2°F | Ht 67.0 in | Wt 132.9 lb

## 2014-02-13 DIAGNOSIS — Z510 Encounter for antineoplastic radiation therapy: Secondary | ICD-10-CM | POA: Diagnosis not present

## 2014-02-13 DIAGNOSIS — C679 Malignant neoplasm of bladder, unspecified: Secondary | ICD-10-CM

## 2014-02-13 NOTE — Progress Notes (Signed)
Sonya Banks has completed 1 fraction to her pelvis.  She denies pain.  She is going to start chemotherapy tomorrow.

## 2014-02-13 NOTE — Progress Notes (Signed)
  Radiation Oncology         [phone] ________________________________  Name: Sonya Banks MRN: [id number]  Date: 02/13/2014  DOB: 09/06/1945  Weekly Radiation Therapy Management  Bladder cancer   Primary site: Urinary Bladder   Staging method: AJCC 7th Edition   Clinical: Stage II (T2, N0, M0)    Summary: Stage II (T2, N0, M0)   Current Dose: 3.6 Gy     Planned Dose:  45 + Gy  Narrative . . . . . . . . The patient presents for routine under treatment assessment.                                   The patient is without complaint.                                 Set-up films were reviewed.                                 The chart was checked. Physical Findings. . .  height is 5\' 7"  (1.702 m) and weight is 132 lb 14.4 oz (60.283 kg). Her oral temperature is 98.2 F (36.8 C). Her blood pressure is 128/86 and her pulse is 76. . Weight essentially stable.  No significant changes. Impression . . . . . . . The patient is tolerating radiation. Plan . . . . . . . . . . . . Continue treatment as planned to 45 gray then consideration for repeat cystoscopy. Patient did start her chemotherapy yesterday and tolerated this well.  ________________________________   Blair Promise, PhD, MD

## 2014-02-14 ENCOUNTER — Other Ambulatory Visit (HOSPITAL_BASED_OUTPATIENT_CLINIC_OR_DEPARTMENT_OTHER): Payer: Medicare HMO

## 2014-02-14 ENCOUNTER — Ambulatory Visit (HOSPITAL_BASED_OUTPATIENT_CLINIC_OR_DEPARTMENT_OTHER): Payer: Medicare HMO

## 2014-02-14 ENCOUNTER — Telehealth: Payer: Self-pay | Admitting: *Deleted

## 2014-02-14 ENCOUNTER — Encounter: Payer: Self-pay | Admitting: Oncology

## 2014-02-14 ENCOUNTER — Ambulatory Visit
Admission: RE | Admit: 2014-02-14 | Discharge: 2014-02-14 | Disposition: A | Discharge status: Home / Self Care | Payer: Medicare Other | Source: Ambulatory Visit | Attending: Radiation Oncology | Admitting: Radiation Oncology

## 2014-02-14 ENCOUNTER — Ambulatory Visit (HOSPITAL_BASED_OUTPATIENT_CLINIC_OR_DEPARTMENT_OTHER): Payer: Medicare HMO | Admitting: Oncology

## 2014-02-14 VITALS — BP 136/78 | HR 75 | Temp 97.7°F | Resp 18 | Ht 67.0 in | Wt 132.6 lb

## 2014-02-14 DIAGNOSIS — Z5111 Encounter for antineoplastic chemotherapy: Secondary | ICD-10-CM

## 2014-02-14 DIAGNOSIS — Z510 Encounter for antineoplastic radiation therapy: Secondary | ICD-10-CM | POA: Diagnosis not present

## 2014-02-14 DIAGNOSIS — C679 Malignant neoplasm of bladder, unspecified: Secondary | ICD-10-CM

## 2014-02-14 DIAGNOSIS — K5900 Constipation, unspecified: Secondary | ICD-10-CM

## 2014-02-14 LAB — COMPREHENSIVE METABOLIC PANEL (CC13)
ALT: 25 U/L (ref 0–55)
ANION GAP: 10 meq/L (ref 3–11)
AST: 30 U/L (ref 5–34)
Albumin: 3.6 g/dL (ref 3.5–5.0)
Alkaline Phosphatase: 103 U/L (ref 40–150)
BILIRUBIN TOTAL: 0.43 mg/dL (ref 0.20–1.20)
BUN: 14.1 mg/dL (ref 7.0–26.0)
CO2: 22 meq/L (ref 22–29)
CREATININE: 0.8 mg/dL (ref 0.6–1.1)
Calcium: 9.7 mg/dL (ref 8.4–10.4)
Chloride: 108 mEq/L (ref 98–109)
Glucose: 97 mg/dl (ref 70–140)
Potassium: 4 mEq/L (ref 3.5–5.1)
SODIUM: 140 meq/L (ref 136–145)
Total Protein: 7.5 g/dL (ref 6.4–8.3)

## 2014-02-14 LAB — CBC WITH DIFFERENTIAL/PLATELET
BASO%: 0.7 % (ref 0.0–2.0)
Basophils Absolute: 0 10*3/uL (ref 0.0–0.1)
EOS%: 2.1 % (ref 0.0–7.0)
Eosinophils Absolute: 0.1 10*3/uL (ref 0.0–0.5)
HEMATOCRIT: 43.9 % (ref 34.8–46.6)
HGB: 14.4 g/dL (ref 11.6–15.9)
LYMPH%: 24.5 % (ref 14.0–49.7)
MCH: 30.1 pg (ref 25.1–34.0)
MCHC: 32.7 g/dL (ref 31.5–36.0)
MCV: 92 fL (ref 79.5–101.0)
MONO#: 0.4 10*3/uL (ref 0.1–0.9)
MONO%: 6.6 % (ref 0.0–14.0)
NEUT#: 3.9 10*3/uL (ref 1.5–6.5)
NEUT%: 66.1 % (ref 38.4–76.8)
Platelets: 231 10*3/uL (ref 145–400)
RBC: 4.77 10*6/uL (ref 3.70–5.45)
RDW: 13.1 % (ref 11.2–14.5)
WBC: 5.8 10*3/uL (ref 3.9–10.3)
lymph#: 1.4 10*3/uL (ref 0.9–3.3)

## 2014-02-14 MED ORDER — ONDANSETRON 8 MG/50ML IVPB (CHCC)
8.0000 mg | Freq: Once | INTRAVENOUS | Status: AC
  Administered 2014-02-14: 8 mg via INTRAVENOUS

## 2014-02-14 MED ORDER — DEXAMETHASONE SODIUM PHOSPHATE 10 MG/ML IJ SOLN
10.0000 mg | Freq: Once | INTRAMUSCULAR | Status: AC
  Administered 2014-02-14: 10 mg via INTRAVENOUS

## 2014-02-14 MED ORDER — DEXAMETHASONE SODIUM PHOSPHATE 10 MG/ML IJ SOLN
INTRAMUSCULAR | Status: AC
  Filled 2014-02-14: qty 1

## 2014-02-14 MED ORDER — SODIUM CHLORIDE 0.9 % IV SOLN
Freq: Once | INTRAVENOUS | Status: AC
  Administered 2014-02-14: 10:00:00 via INTRAVENOUS

## 2014-02-14 MED ORDER — SODIUM CHLORIDE 0.9 % IV SOLN
153.4000 mg | Freq: Once | INTRAVENOUS | Status: AC
  Administered 2014-02-14: 150 mg via INTRAVENOUS
  Filled 2014-02-14: qty 15

## 2014-02-14 MED ORDER — ONDANSETRON HCL 8 MG PO TABS: 8.0000 mg | ORAL_TABLET | Freq: Three times a day (TID) | ORAL | Status: DC | PRN

## 2014-02-14 MED ORDER — ONDANSETRON 8 MG/NS 50 ML IVPB
INTRAVENOUS | Status: AC
  Filled 2014-02-14: qty 8

## 2014-02-14 NOTE — Progress Notes (Signed)
Hematology and Oncology Follow Up Visit  Sonya Banks [id number] 1946-05-19 68 y.o. 02/14/2014 8:35 AM MCKEOWN,WILLIAM DAVID, MDMcKeown, [name], MD   Principle Diagnosis: 68 year old woman diagnosed with muscle invasive bladder cancer in June of 2015 with clinical staging of T2 N0.    Prior Therapy: She is status post TURBT on June 20 13,015 without any residual tumors. She is status post repeat cystoscopy and biopsies obtained on 01/16/2014.  Current therapy: Radiation therapy concomitantly with weekly carboplatin. Her first week of chemotherapy to be given today.  Interim History:  Sonya Banks presents today for a followup visit. Since her last visit, she underwent the first few days of radiation therapy without any complications. She tolerated it with minimum side effects of causing slight fatigue and mild nausea. She is not reporting any abdominal pain or discomfort. Is not reporting any hematuria or dysuria. She does not report any headaches or blurry vision or double vision. Is that report any syncope or seizures. She does not report any chest pain or palpitation orthopnea. Does not report any shortness of breath or cough. She does not report any frequency urgency or hesitancy. She continues to have excellent performance status and quality of life. She is ready to proceed with chemotherapy. Rest of her review of systems unremarkable.  Medications: I have reviewed the patient's current medications.  Current Outpatient Prescriptions  Medication Sig Dispense Refill  . aspirin EC 81 MG tablet Take 81 mg by mouth daily.      Marland Kitchen buPROPion (WELLBUTRIN XL) 300 MG 24 hr tablet Take 300 mg by mouth every morning.      . Cholecalciferol (VITAMIN D PO) Take 5,000 Units by mouth daily.      Marland Kitchen EPINEPHrine (EPIPEN) 0.3 mg/0.3 mL IJ SOAJ injection Inject 0.3 mg into the muscle once as needed (Bee Sting).      . Flaxseed, Linseed, (FLAXSEED OIL) 1000 MG CAPS Take 1,000 mg by mouth daily.       Marland Kitchen  FLUoxetine (PROZAC) 20 MG capsule Take 20 mg by mouth every morning.       Nyoka Cowden Tea, Camillia sinensis, (GREEN TEA EXTRACT PO) Take 1 tablet by mouth daily.       Marland Kitchen LORazepam (ATIVAN) 2 MG tablet Take 1 mg by mouth at bedtime as needed for anxiety.       . Magnesium 250 MG TABS Take 250 mg by mouth daily.       . meloxicam (MOBIC) 7.5 MG tablet Take 7.5 mg by mouth daily.      . Multiple Vitamin (MULTIVITAMIN WITH MINERALS) TABS tablet Take 1 tablet by mouth daily.      . Omega-3 Fatty Acids (FISH OIL) 1000 MG CPDR Take 1,000 mg by mouth daily.       Marland Kitchen OVER THE COUNTER MEDICATION Take 1 tablet by mouth daily. Broccoli & Kale powder      . Probiotic Product (PROBIOTIC DAILY PO) Take 1 tablet by mouth daily.      .        . verapamil (CALAN-SR) 240 MG CR tablet Take 120 mg by mouth at bedtime as needed (when she is having cluster headaches).       No current facility-administered medications for this visit.     Allergies:  Allergies  Allergen Reactions  . Bee Venom Anaphylaxis  . Sulfa Antibiotics Swelling, Anaphylaxis, Hives and Shortness Of Breath     Starts Internally, hard to breath , with large patches of hives.  . Ibuprofen  Other (See Comments)    Patient's family has a history of kidney problems and MD stated not to take Ibuprofen.  . Latex     WHEN PT WEARS LATEX GLOVES - HANDS START ITCHING  . Other     PT REPORTS HX OF MALIGNANT HYPERTHERMIA WITH D&C 1980 -  ? MALIGNANT HYPERTHERMIA VS PSEUDOCHOLINESTERASE DEFICIENCY?  Marland Kitchen Prednisone     Pain/ broken blood vessels with high dose    Past Medical History, Surgical history, Social history, and Family History were reviewed and updated.   Physical Exam: Blood pressure 136/78, pulse 75, temperature 97.7 F (36.5 C), temperature source Oral, resp. rate 18, height 5\' 7"  (1.702 m), weight 132 lb 9.6 oz (60.147 kg), SpO2 98.00%. ECOG: 0 General appearance: alert and cooperative Head: Normocephalic, without obvious  abnormality Neck: no adenopathy Lymph nodes: Cervical, supraclavicular, and axillary nodes normal. Heart:regular rate and rhythm, S1, S2 normal, no murmur, click, rub or gallop Lung:chest clear, no wheezing, rales, normal symmetric air entry, Heart exam - S1, S2 normal, no murmur, no gallop, rate regular Abdomin: soft, non-tender, without masses or organomegaly EXT:no erythema, induration, or nodules   Lab Results: Lab Results  Component Value Date   WBC 5.8 02/14/2014   HGB 14.4 02/14/2014   HCT 43.9 02/14/2014   MCV 92.0 02/14/2014   PLT 231 02/14/2014     Chemistry      Component Value Date/Time   NA 141 01/18/2014 1124   NA 141 01/09/2014 1330   K 4.3 01/18/2014 1124   K 4.9 01/09/2014 1330   CL 103 01/09/2014 1330   CO2 28 01/18/2014 1124   CO2 28 01/09/2014 1330   BUN 12.2 01/18/2014 1124   BUN 18 01/09/2014 1330   CREATININE 0.8 01/18/2014 1124   CREATININE 0.97 01/09/2014 1330   CREATININE 0.77 07/11/2013 1652      Component Value Date/Time   CALCIUM 9.7 01/18/2014 1124   CALCIUM 10.2 01/09/2014 1330   ALKPHOS 90 01/18/2014 1124   ALKPHOS 84 07/11/2013 1652   AST 41* 01/18/2014 1124   AST 21 07/11/2013 1652   ALT 28 01/18/2014 1124   ALT 11 07/11/2013 1652   BILITOT 0.47 01/18/2014 1124   BILITOT 0.4 07/11/2013 1652       Impression and Plan:  68 year old woman with the following issues:  1. Muscle invasive bladder cancer presented with T2 N0 disease without any evidence of metastasis. Patient refused definitive surgery at this time and opted to proceed with radiation therapy concomitantly with chemotherapy. She is to proceed with week 1 of carboplatin without any hesitation or delay. Laboratory data were reviewed and there is no abnormalities to prevent the start of chemotherapy.  2. IV access: We will use her peripheral veins for the time being.  3. Antiemetics: I gave her a prescription of Zofran as well.  4. Constipation: I have advised her to use MiraLAX daily to assist in her  symptoms.  5.  Followup: Will be in 2 weeks to assess for any complications.  [facility], MD 9/2/20158:35 AM

## 2014-02-14 NOTE — Telephone Encounter (Signed)
Call from pt reporting L arm IV site is swollen, bruised and tender since removing dressing. Reviewed with Shirlean Mylar, RN: Too soon to remove dressing. Instructed her to apply pressure to site. Apply ice today. Pt voiced understanding, she will call office 9/3 if pain has not resolved. Will work in for site assessment.

## 2014-02-14 NOTE — Patient Instructions (Addendum)
[facility] Discharge Instructions for Patients Receiving Chemotherapy  Today you received the following chemotherapy agents carboplatin,  To help prevent nausea and vomiting after your treatment, we encourage you to take your nausea medication zofran 8mg  by mouth every 8 hours as needed.   If you develop nausea and vomiting that is not controlled by your nausea medication, call the clinic.   BELOW ARE SYMPTOMS THAT SHOULD BE REPORTED IMMEDIATELY:  *FEVER GREATER THAN 100.5 F  *CHILLS WITH OR WITHOUT FEVER  NAUSEA AND VOMITING THAT IS NOT CONTROLLED WITH YOUR NAUSEA MEDICATION  *UNUSUAL SHORTNESS OF BREATH  *UNUSUAL BRUISING OR BLEEDING  TENDERNESS IN MOUTH AND THROAT WITH OR WITHOUT PRESENCE OF ULCERS  *URINARY PROBLEMS  *BOWEL PROBLEMS  UNUSUAL RASH Items with * indicate a potential emergency and should be followed up as soon as possible.  Feel free to call the clinic you have any questions or concerns. The clinic phone number is [phone].

## 2014-02-15 ENCOUNTER — Ambulatory Visit
Admission: RE | Admit: 2014-02-15 | Discharge: 2014-02-15 | Disposition: A | Discharge status: Home / Self Care | Payer: Medicare Other | Source: Ambulatory Visit | Attending: Radiation Oncology | Admitting: Radiation Oncology

## 2014-02-15 ENCOUNTER — Telehealth: Payer: Self-pay | Admitting: Medical Oncology

## 2014-02-15 ENCOUNTER — Telehealth: Payer: Self-pay | Admitting: *Deleted

## 2014-02-15 DIAGNOSIS — Z510 Encounter for antineoplastic radiation therapy: Secondary | ICD-10-CM | POA: Diagnosis not present

## 2014-02-15 DIAGNOSIS — C679 Malignant neoplasm of bladder, unspecified: Secondary | ICD-10-CM

## 2014-02-15 MED ORDER — PROCHLORPERAZINE MALEATE 10 MG PO TABS
10.0000 mg | ORAL_TABLET | Freq: Four times a day (QID) | ORAL | Status: DC | PRN
Start: 2014-02-15 — End: 2014-09-05

## 2014-02-15 NOTE — Telephone Encounter (Signed)
Verbal order received and read back from Dr. Alen Blew for compazine 10 mg q 6 hrs as needed for n/v.  Called patient with this order.

## 2014-02-15 NOTE — Telephone Encounter (Signed)
Called [facility] for chemotherapy F/U.  Patient is doing well.  Denies n/v, however she did take a zofran at bedtime last night.  Reports terrible headache last night.  Has a "history of cluster headaches and took verapamil 120 mg with no relief and was up all night tossing and turning".  Needs a prior authorization but pharmacy gave three zofran pills until P.A. obtained.  Denies any further side effects or symptoms.  Bowel and bladder is functioning well only bm was very small..  Eating and drinking well.  Instructed to drink 64 oz minimum daily or at least the day before, of and after treatment.  Reports drinking five 16 oz waters daily.  Denies questions at this time and encouraged to call if needed.  Reviewed how to call after hours in the case of an emergency.  Left a note for provider when here for RT in reference to the Zofran prior authorization needed.  May need a second anti-emetic.  This nurse asked if she is using a laxative or stool softener.  Senokot-S named during this conversation.

## 2014-02-15 NOTE — Telephone Encounter (Signed)
Message copied by Cherylynn Ridges on Thu Feb 15, 2014  1:57 PM ------      Message from: Judyann Munson R      Created: Wed Feb 14, 2014 10:54 AM      Regarding: chemo follow up call      Contact: [phone]       Carboplatin- first dose 02/14/14- Dr. Alen Blew ------

## 2014-02-15 NOTE — Telephone Encounter (Signed)
Patient called stating had "a really bad headache last night" asking if this was related to her tx? Informed patient that zofran, that she received for anti nausea, could cause headaches for some people. Patient confirmed headache is gone but just wanted to know.   Also informed office that her pharm told her she needed preauth for the p.o zofran.   I followed up with [facility] outpt pharm and they obtained preauth for pt. Patient informed. Knows to p.u prescription at [facility]. Denies further questions.

## 2014-02-15 NOTE — Telephone Encounter (Signed)
[facility] [address] faxed Prior authorization request for Zofran 8 mg.  Request to Managed Care for review.

## 2014-02-16 ENCOUNTER — Encounter: Payer: Self-pay | Admitting: Oncology

## 2014-02-16 ENCOUNTER — Ambulatory Visit
Admission: RE | Admit: 2014-02-16 | Discharge: 2014-02-16 | Disposition: A | Discharge status: Home / Self Care | Payer: Medicare Other | Source: Ambulatory Visit | Attending: Radiation Oncology | Admitting: Radiation Oncology

## 2014-02-16 DIAGNOSIS — Z510 Encounter for antineoplastic radiation therapy: Secondary | ICD-10-CM | POA: Diagnosis not present

## 2014-02-16 NOTE — Progress Notes (Signed)
[place], [id number], approved ondansetron from 02/15/14-06/14/14 pt id # [id number]

## 2014-02-20 ENCOUNTER — Encounter: Payer: Self-pay | Admitting: *Deleted

## 2014-02-20 ENCOUNTER — Ambulatory Visit
Admission: RE | Admit: 2014-02-20 | Discharge: 2014-02-20 | Disposition: A | Discharge status: Home / Self Care | Payer: Medicare Other | Source: Ambulatory Visit | Attending: Radiation Oncology | Admitting: Radiation Oncology

## 2014-02-20 DIAGNOSIS — Z510 Encounter for antineoplastic radiation therapy: Secondary | ICD-10-CM | POA: Diagnosis not present

## 2014-02-20 NOTE — Progress Notes (Signed)
RECEIVED A FAX FROM [facility] CONCERNING A PRIOR AUTHORIZATION FOR ONDANSETRON. THIS REQUEST WAS PLACED IN THE MANAGED CARE BIN. 

## 2014-02-21 ENCOUNTER — Encounter: Payer: Self-pay | Admitting: Radiation Oncology

## 2014-02-21 ENCOUNTER — Ambulatory Visit
Admission: RE | Admit: 2014-02-21 | Discharge: 2014-02-21 | Disposition: A | Discharge status: Home / Self Care | Payer: Medicare Other | Source: Ambulatory Visit | Attending: Radiation Oncology | Admitting: Radiation Oncology

## 2014-02-21 ENCOUNTER — Other Ambulatory Visit (HOSPITAL_BASED_OUTPATIENT_CLINIC_OR_DEPARTMENT_OTHER): Payer: Medicare HMO

## 2014-02-21 ENCOUNTER — Ambulatory Visit (HOSPITAL_BASED_OUTPATIENT_CLINIC_OR_DEPARTMENT_OTHER): Payer: Medicare HMO

## 2014-02-21 VITALS — BP 134/74 | HR 66 | Temp 97.7°F | Ht 67.0 in | Wt 133.0 lb

## 2014-02-21 VITALS — BP 143/80 | HR 70 | Temp 98.2°F | Resp 18

## 2014-02-21 DIAGNOSIS — Z510 Encounter for antineoplastic radiation therapy: Secondary | ICD-10-CM | POA: Diagnosis not present

## 2014-02-21 DIAGNOSIS — Z5111 Encounter for antineoplastic chemotherapy: Secondary | ICD-10-CM

## 2014-02-21 DIAGNOSIS — C679 Malignant neoplasm of bladder, unspecified: Secondary | ICD-10-CM

## 2014-02-21 LAB — COMPREHENSIVE METABOLIC PANEL (CC13)
ALT: 21 U/L (ref 0–55)
ANION GAP: 8 meq/L (ref 3–11)
AST: 27 U/L (ref 5–34)
Albumin: 3.4 g/dL — ABNORMAL LOW (ref 3.5–5.0)
Alkaline Phosphatase: 79 U/L (ref 40–150)
BILIRUBIN TOTAL: 0.53 mg/dL (ref 0.20–1.20)
BUN: 13.9 mg/dL (ref 7.0–26.0)
CHLORIDE: 106 meq/L (ref 98–109)
CO2: 25 mEq/L (ref 22–29)
CREATININE: 0.8 mg/dL (ref 0.6–1.1)
Calcium: 9.3 mg/dL (ref 8.4–10.4)
Glucose: 95 mg/dl (ref 70–140)
Potassium: 4.3 mEq/L (ref 3.5–5.1)
Sodium: 139 mEq/L (ref 136–145)
Total Protein: 7 g/dL (ref 6.4–8.3)

## 2014-02-21 LAB — CBC WITH DIFFERENTIAL/PLATELET
BASO%: 0.6 % (ref 0.0–2.0)
BASOS ABS: 0 10*3/uL (ref 0.0–0.1)
EOS%: 6.6 % (ref 0.0–7.0)
Eosinophils Absolute: 0.2 10*3/uL (ref 0.0–0.5)
HEMATOCRIT: 40.7 % (ref 34.8–46.6)
HEMOGLOBIN: 13.1 g/dL (ref 11.6–15.9)
LYMPH#: 0.9 10*3/uL (ref 0.9–3.3)
LYMPH%: 23.9 % (ref 14.0–49.7)
MCH: 30.1 pg (ref 25.1–34.0)
MCHC: 32.3 g/dL (ref 31.5–36.0)
MCV: 93 fL (ref 79.5–101.0)
MONO#: 0.3 10*3/uL (ref 0.1–0.9)
MONO%: 7.5 % (ref 0.0–14.0)
NEUT#: 2.3 10*3/uL (ref 1.5–6.5)
NEUT%: 61.4 % (ref 38.4–76.8)
PLATELETS: 201 10*3/uL (ref 145–400)
RBC: 4.37 10*6/uL (ref 3.70–5.45)
RDW: 13 % (ref 11.2–14.5)
WBC: 3.7 10*3/uL — ABNORMAL LOW (ref 3.9–10.3)

## 2014-02-21 MED ORDER — SODIUM CHLORIDE 0.9 % IV SOLN
Freq: Once | INTRAVENOUS | Status: AC
  Administered 2014-02-21: 10:00:00 via INTRAVENOUS

## 2014-02-21 MED ORDER — DEXAMETHASONE SODIUM PHOSPHATE 10 MG/ML IJ SOLN
INTRAMUSCULAR | Status: AC
  Filled 2014-02-21: qty 1

## 2014-02-21 MED ORDER — SODIUM CHLORIDE 0.9 % IV SOLN
153.4000 mg | Freq: Once | INTRAVENOUS | Status: AC
  Administered 2014-02-21: 150 mg via INTRAVENOUS
  Filled 2014-02-21: qty 15

## 2014-02-21 MED ORDER — DEXAMETHASONE SODIUM PHOSPHATE 10 MG/ML IJ SOLN
10.0000 mg | Freq: Once | INTRAMUSCULAR | Status: AC
  Administered 2014-02-21: 10 mg via INTRAVENOUS

## 2014-02-21 NOTE — Progress Notes (Signed)
Sonya Banks has completed 7 fractions to her pelvis.  She denies pain but did have a bladder spasm yesterday that lasted a few seconds.  She reports an increase in urinary frequency.  She denies dysuria and hematuria.  She had her second round of chemotherapy today.  She reports fatigue.

## 2014-02-21 NOTE — Patient Instructions (Signed)
[facility] Discharge Instructions for Patients Receiving Chemotherapy  Today you received the following chemotherapy agents: Carboplatin.  To help prevent nausea and vomiting after your treatment, we encourage you to take your nausea medication as directed  If you develop nausea and vomiting that is not controlled by your nausea medication, call the clinic.   BELOW ARE SYMPTOMS THAT SHOULD BE REPORTED IMMEDIATELY:  *FEVER GREATER THAN 100.5 F  *CHILLS WITH OR WITHOUT FEVER  NAUSEA AND VOMITING THAT IS NOT CONTROLLED WITH YOUR NAUSEA MEDICATION  *UNUSUAL SHORTNESS OF BREATH  *UNUSUAL BRUISING OR BLEEDING  TENDERNESS IN MOUTH AND THROAT WITH OR WITHOUT PRESENCE OF ULCERS  *URINARY PROBLEMS  *BOWEL PROBLEMS  UNUSUAL RASH Items with * indicate a potential emergency and should be followed up as soon as possible.  Feel free to call the clinic you have any questions or concerns. The clinic phone number is [phone].  

## 2014-02-21 NOTE — Progress Notes (Signed)
  Radiation Oncology         [phone] ________________________________  Name: Sonya Banks MRN: [id number]  Date: 02/21/2014  DOB: 08-17-1945  Weekly Radiation Therapy Management  Bladder cancer   Primary site: Urinary Bladder   Staging method: AJCC 7th Edition   Clinical: Stage II (T2, N0, M0)    Summary: Stage II (T2, N0, M0)   Current Dose: 12.6 Gy     Planned Dose:  45 + Gy  Narrative . . . . . . . . The patient presents for routine under treatment assessment.                                    She did have one set up of what she describes as a bladder spasm. She denies any dysuria or hematuria. She received additional chemotherapy today. She does have some fatigue.                                 Set-up films were reviewed.                                 The chart was checked. Physical Findings. . .  height is 5\' 7"  (1.702 m) and weight is 133 lb (60.328 kg). Her oral temperature is 97.7 F (36.5 C). Her blood pressure is 134/74 and her pulse is 66. . The lungs are clear. The heart has regular rhythm and rate. The abdomen is soft and nontender with normal bowel sounds. Impression . . . . . . . The patient is tolerating radiation. Plan . . . . . . . . . . . . Continue treatment as planned.  ________________________________   Blair Promise, PhD, MD

## 2014-02-22 ENCOUNTER — Encounter: Payer: Self-pay | Admitting: Radiation Oncology

## 2014-02-22 ENCOUNTER — Ambulatory Visit
Admission: RE | Admit: 2014-02-22 | Discharge: 2014-02-22 | Disposition: A | Discharge status: Home / Self Care | Payer: Medicare Other | Source: Ambulatory Visit | Attending: Radiation Oncology | Admitting: Radiation Oncology

## 2014-02-22 ENCOUNTER — Ambulatory Visit
Admission: RE | Admit: 2014-02-22 | Discharge: 2014-02-22 | Disposition: A | Discharge status: Home / Self Care | Payer: Medicare HMO | Source: Ambulatory Visit | Attending: Radiation Oncology | Admitting: Radiation Oncology

## 2014-02-22 VITALS — BP 124/79 | HR 85 | Temp 97.7°F

## 2014-02-22 DIAGNOSIS — C679 Malignant neoplasm of bladder, unspecified: Secondary | ICD-10-CM

## 2014-02-22 DIAGNOSIS — Z510 Encounter for antineoplastic radiation therapy: Secondary | ICD-10-CM | POA: Diagnosis not present

## 2014-02-22 LAB — URINALYSIS, MICROSCOPIC - CHCC
BILIRUBIN (URINE): NEGATIVE
Blood: NEGATIVE
Glucose: NEGATIVE mg/dL
KETONES: NEGATIVE mg/dL
Nitrite: NEGATIVE
Protein: NEGATIVE mg/dL
RBC / HPF: NEGATIVE (ref 0–2)
SPECIFIC GRAVITY, URINE: 1.005 (ref 1.003–1.035)
Urobilinogen, UR: 0.2 mg/dL (ref 0.2–1)
pH: 6.5 (ref 4.6–8.0)

## 2014-02-22 NOTE — Progress Notes (Signed)
Sonya Banks is here due to urinary frequency and urgency.  Yesterday she urinated 8 times between 4:30 and 8:30 and also got up 4 times to urinate during the night.  She has noticed burning towards the end of urinating occasionally.  She is worried that this will lead to incontinence.

## 2014-02-23 ENCOUNTER — Ambulatory Visit
Admission: RE | Admit: 2014-02-23 | Discharge: 2014-02-23 | Disposition: A | Discharge status: Home / Self Care | Payer: Medicare Other | Source: Ambulatory Visit | Attending: Radiation Oncology | Admitting: Radiation Oncology

## 2014-02-23 DIAGNOSIS — Z510 Encounter for antineoplastic radiation therapy: Secondary | ICD-10-CM | POA: Diagnosis not present

## 2014-02-23 LAB — URINE CULTURE

## 2014-02-26 ENCOUNTER — Ambulatory Visit
Admission: RE | Admit: 2014-02-26 | Discharge: 2014-02-26 | Disposition: A | Discharge status: Home / Self Care | Payer: Medicare Other | Source: Ambulatory Visit | Attending: Radiation Oncology | Admitting: Radiation Oncology

## 2014-02-26 ENCOUNTER — Telehealth: Payer: Self-pay | Admitting: Oncology

## 2014-02-26 DIAGNOSIS — Z510 Encounter for antineoplastic radiation therapy: Secondary | ICD-10-CM | POA: Diagnosis not present

## 2014-02-26 NOTE — Telephone Encounter (Signed)
Called Sonya Banks and informed her of the urine culture results per Dr Sondra Come.  She verbalized understanding.

## 2014-02-27 ENCOUNTER — Encounter: Payer: Self-pay | Admitting: Radiation Oncology

## 2014-02-27 ENCOUNTER — Ambulatory Visit
Admission: RE | Admit: 2014-02-27 | Discharge: 2014-02-27 | Disposition: A | Discharge status: Home / Self Care | Payer: Medicare Other | Source: Ambulatory Visit | Attending: Radiation Oncology | Admitting: Radiation Oncology

## 2014-02-27 VITALS — BP 124/73 | HR 73 | Temp 97.5°F | Ht 67.0 in | Wt 130.8 lb

## 2014-02-27 DIAGNOSIS — Z510 Encounter for antineoplastic radiation therapy: Secondary | ICD-10-CM | POA: Diagnosis not present

## 2014-02-27 DIAGNOSIS — C679 Malignant neoplasm of bladder, unspecified: Secondary | ICD-10-CM

## 2014-02-27 MED ORDER — RADIAPLEXRX EX GEL
Freq: Once | CUTANEOUS | Status: AC
  Administered 2014-02-27: 14:00:00 via TOPICAL

## 2014-02-27 NOTE — Progress Notes (Signed)
  Radiation Oncology         [phone] ________________________________  Name: Sonya Banks MRN: [id number]  Date: 02/27/2014  DOB: 03-10-1946  Weekly Radiation Therapy Management  Bladder cancer   Primary site: Urinary Bladder   Staging method: AJCC 7th Edition   Clinical: Stage II (T2, N0, M0)   Summary: Stage II (T2, N0, M0)   Current Dose: 19.8 Gy     Planned Dose:  45 + Gy  Narrative . . . . . . . . The patient presents for routine under treatment assessment.                                   The patient has noticed some fatigue with her treatments. She is also noticed some mild diarrhea and will use Imodium for this issue. Patient reports bladder spasm last night. She denies any hematuria. Patient continues to receive chemotherapy every Thursday. She had some dysuria. Her urine culture showed no evidence of infection.                                  Set-up films were reviewed.                                 The chart was checked. Physical Findings. . .  height is 5\' 7"  (1.702 m) and weight is 130 lb 12.8 oz (59.33 kg). Her oral temperature is 97.5 F (36.4 C). Her blood pressure is 124/73 and her pulse is 73. . The lungs are clear. The heart has a regular rhythm and rate. The abdomen is soft and nontender with normal bowel sounds. Impression . . . . . . . The patient is tolerating radiation. Plan . . . . . . . . . . . . Continue treatment as planned.  ________________________________   Blair Promise, PhD, MD

## 2014-02-27 NOTE — Progress Notes (Addendum)
Sonya Banks has completed 11 fractions to her pelvis.  She denies pain but has cramping in her bladder with the last episode happening last night.  She reports urinary frequency and burning towards the end.  She reports having diarrhea twice this morning.  She has lost 3 lbs since 02/21/14. Advised her to take Imodium as needed.  She has chemotherapy on Thursday.  She reports fatigue. She reports dry skin on her lower abdomen.  Radiaplex gel given and she was instructed to apply it twice a day after treatment and bedtime.

## 2014-02-27 NOTE — Addendum Note (Signed)
Encounter addended by: Jacqulyn Liner, RN on: 02/27/2014  1:49 PM<BR>     Documentation filed: Inpatient MAR

## 2014-02-27 NOTE — Addendum Note (Signed)
Encounter addended by: Jacqulyn Liner, RN on: 02/27/2014  1:48 PM<BR>     Documentation filed: Orders

## 2014-02-28 ENCOUNTER — Ambulatory Visit (HOSPITAL_BASED_OUTPATIENT_CLINIC_OR_DEPARTMENT_OTHER): Payer: Medicare HMO

## 2014-02-28 ENCOUNTER — Other Ambulatory Visit (HOSPITAL_BASED_OUTPATIENT_CLINIC_OR_DEPARTMENT_OTHER): Payer: Medicare HMO

## 2014-02-28 ENCOUNTER — Ambulatory Visit (HOSPITAL_BASED_OUTPATIENT_CLINIC_OR_DEPARTMENT_OTHER): Payer: Medicare HMO | Admitting: Oncology

## 2014-02-28 ENCOUNTER — Ambulatory Visit
Admission: RE | Admit: 2014-02-28 | Discharge: 2014-02-28 | Disposition: A | Discharge status: Home / Self Care | Payer: Medicare Other | Source: Ambulatory Visit | Attending: Radiation Oncology | Admitting: Radiation Oncology

## 2014-02-28 ENCOUNTER — Encounter: Payer: Self-pay | Admitting: Oncology

## 2014-02-28 VITALS — BP 116/78 | HR 72 | Temp 97.8°F | Resp 19 | Ht 67.0 in | Wt 130.9 lb

## 2014-02-28 DIAGNOSIS — C679 Malignant neoplasm of bladder, unspecified: Secondary | ICD-10-CM

## 2014-02-28 DIAGNOSIS — R197 Diarrhea, unspecified: Secondary | ICD-10-CM

## 2014-02-28 DIAGNOSIS — Z5111 Encounter for antineoplastic chemotherapy: Secondary | ICD-10-CM

## 2014-02-28 DIAGNOSIS — Z510 Encounter for antineoplastic radiation therapy: Secondary | ICD-10-CM | POA: Diagnosis not present

## 2014-02-28 LAB — CBC WITH DIFFERENTIAL/PLATELET
BASO%: 0.3 % (ref 0.0–2.0)
Basophils Absolute: 0 10*3/uL (ref 0.0–0.1)
EOS ABS: 0.4 10*3/uL (ref 0.0–0.5)
EOS%: 10 % — AB (ref 0.0–7.0)
HCT: 42.8 % (ref 34.8–46.6)
HGB: 13.8 g/dL (ref 11.6–15.9)
LYMPH%: 19.4 % (ref 14.0–49.7)
MCH: 30.1 pg (ref 25.1–34.0)
MCHC: 32.3 g/dL (ref 31.5–36.0)
MCV: 93.3 fL (ref 79.5–101.0)
MONO#: 0.3 10*3/uL (ref 0.1–0.9)
MONO%: 8.9 % (ref 0.0–14.0)
NEUT%: 61.4 % (ref 38.4–76.8)
NEUTROS ABS: 2.3 10*3/uL (ref 1.5–6.5)
Platelets: 161 10*3/uL (ref 145–400)
RBC: 4.59 10*6/uL (ref 3.70–5.45)
RDW: 13.2 % (ref 11.2–14.5)
WBC: 3.7 10*3/uL — ABNORMAL LOW (ref 3.9–10.3)
lymph#: 0.7 10*3/uL — ABNORMAL LOW (ref 0.9–3.3)

## 2014-02-28 LAB — COMPREHENSIVE METABOLIC PANEL (CC13)
ALK PHOS: 84 U/L (ref 40–150)
ALT: 22 U/L (ref 0–55)
AST: 27 U/L (ref 5–34)
Albumin: 3.5 g/dL (ref 3.5–5.0)
Anion Gap: 10 mEq/L (ref 3–11)
BUN: 12.3 mg/dL (ref 7.0–26.0)
CHLORIDE: 107 meq/L (ref 98–109)
CO2: 22 mEq/L (ref 22–29)
Calcium: 9.2 mg/dL (ref 8.4–10.4)
Creatinine: 0.8 mg/dL (ref 0.6–1.1)
Glucose: 77 mg/dl (ref 70–140)
POTASSIUM: 3.9 meq/L (ref 3.5–5.1)
SODIUM: 139 meq/L (ref 136–145)
TOTAL PROTEIN: 7.1 g/dL (ref 6.4–8.3)
Total Bilirubin: 0.38 mg/dL (ref 0.20–1.20)

## 2014-02-28 MED ORDER — DEXAMETHASONE SODIUM PHOSPHATE 10 MG/ML IJ SOLN
10.0000 mg | Freq: Once | INTRAMUSCULAR | Status: DC
  Administered 2014-02-28: 10 mg via INTRAVENOUS

## 2014-02-28 MED ORDER — ONDANSETRON 8 MG/50ML IVPB (CHCC): 8.0000 mg | Freq: Once | INTRAVENOUS | Status: DC

## 2014-02-28 MED ORDER — SODIUM CHLORIDE 0.9 % IV SOLN
Freq: Once | INTRAVENOUS | Status: AC
  Administered 2014-02-28: 10:00:00 via INTRAVENOUS

## 2014-02-28 MED ORDER — ONDANSETRON 8 MG/NS 50 ML IVPB
INTRAVENOUS | Status: AC
  Filled 2014-02-28: qty 8

## 2014-02-28 MED ORDER — SODIUM CHLORIDE 0.9 % IV SOLN
153.4000 mg | Freq: Once | INTRAVENOUS | Status: AC
  Administered 2014-02-28: 150 mg via INTRAVENOUS
  Filled 2014-02-28: qty 15

## 2014-02-28 MED ORDER — DEXAMETHASONE SODIUM PHOSPHATE 10 MG/ML IJ SOLN
INTRAMUSCULAR | Status: AC
  Filled 2014-02-28: qty 1

## 2014-02-28 NOTE — Patient Instructions (Signed)
[facility] Discharge Instructions for Patients Receiving Chemotherapy  Today you received the following chemotherapy agents: Carboplatin.  To help prevent nausea and vomiting after your treatment, we encourage you to take your nausea medication as directed  If you develop nausea and vomiting that is not controlled by your nausea medication, call the clinic.   BELOW ARE SYMPTOMS THAT SHOULD BE REPORTED IMMEDIATELY:  *FEVER GREATER THAN 100.5 F  *CHILLS WITH OR WITHOUT FEVER  NAUSEA AND VOMITING THAT IS NOT CONTROLLED WITH YOUR NAUSEA MEDICATION  *UNUSUAL SHORTNESS OF BREATH  *UNUSUAL BRUISING OR BLEEDING  TENDERNESS IN MOUTH AND THROAT WITH OR WITHOUT PRESENCE OF ULCERS  *URINARY PROBLEMS  *BOWEL PROBLEMS  UNUSUAL RASH Items with * indicate a potential emergency and should be followed up as soon as possible.  Feel free to call the clinic you have any questions or concerns. The clinic phone number is [phone].  

## 2014-02-28 NOTE — Progress Notes (Signed)
Hematology and Oncology Follow Up Visit  Sonya Banks [id number] 09/15/1945 68 y.o. 02/28/2014 8:51 AM Sonya Banks, MDMcKeown, Sonya Saxon, MD   Principle Diagnosis: 68 year old woman diagnosed with muscle invasive bladder cancer in June of 2015 with clinical staging of T2 N0.    Prior Therapy: She is status post TURBT on June 20 13,015 without any residual tumors. She is status post repeat cystoscopy and biopsies obtained on 01/16/2014.  Current therapy: Radiation therapy concomitantly with weekly carboplatin. She is status post 2 weeks of treatment.  Interim History:  Sonya Banks presents today for a followup visit. Since her last visit, she underwent the first 2 weeks of chemotherapy with radiation therapy without any complications. She tolerated it with minimum side effects of causing slight fatigue and mild nausea. She did report headache related to the Zofran might have subsided after stopping it. She is not reporting any abdominal pain or discomfort. Is not reporting any hematuria or dysuria. She does not report any headaches or blurry vision or double vision. Is that report any syncope or seizures. She does not report any chest pain or palpitation orthopnea. Does not report any shortness of breath or cough. She does not report any frequency urgency or hesitancy. She continues to have excellent performance status and quality of life. She is ready to proceed with chemotherapy. Rest of her review of systems unremarkable.  Medications: I have reviewed the patient's current medications.  Current Outpatient Prescriptions  Medication Sig Dispense Refill  . aspirin EC 81 MG tablet Take 81 mg by mouth daily.      Marland Kitchen buPROPion (WELLBUTRIN XL) 300 MG 24 hr tablet Take 300 mg by mouth every morning.      . Cholecalciferol (VITAMIN D PO) Take 5,000 Units by mouth daily.      Marland Kitchen EPINEPHrine (EPIPEN) 0.3 mg/0.3 mL IJ SOAJ injection Inject 0.3 mg into the muscle once as needed (Bee Sting).       . Flaxseed, Linseed, (FLAXSEED OIL) 1000 MG CAPS Take 1,000 mg by mouth daily.       Marland Kitchen FLUoxetine (PROZAC) 20 MG capsule Take 20 mg by mouth every morning.       Nyoka Cowden Tea, Camillia sinensis, (GREEN TEA EXTRACT PO) Take 1 tablet by mouth daily.       Marland Kitchen LORazepam (ATIVAN) 2 MG tablet Take 1 mg by mouth at bedtime as needed for anxiety.       . Magnesium 250 MG TABS Take 250 mg by mouth daily.       . meloxicam (MOBIC) 7.5 MG tablet Take 7.5 mg by mouth daily.      . Multiple Vitamin (MULTIVITAMIN WITH MINERALS) TABS tablet Take 1 tablet by mouth daily.      . Omega-3 Fatty Acids (FISH OIL) 1000 MG CPDR Take 1,000 mg by mouth daily.       Marland Kitchen OVER THE COUNTER MEDICATION Take 1 tablet by mouth daily. Broccoli & Kale powder      . Probiotic Product (PROBIOTIC DAILY PO) Take 1 tablet by mouth daily.      .        . verapamil (CALAN-SR) 240 MG CR tablet Take 120 mg by mouth at bedtime as needed (when she is having cluster headaches).       No current facility-administered medications for this visit.     Allergies:  Allergies  Allergen Reactions  . Bee Venom Anaphylaxis  . Sulfa Antibiotics Swelling, Anaphylaxis, Hives and Shortness Of Breath  Starts Internally, hard to breath , with large patches of hives.  . Ibuprofen Other (See Comments)    Patient's family has a history of kidney problems and MD stated not to take Ibuprofen.  . Latex     WHEN PT WEARS LATEX GLOVES - HANDS START ITCHING  . Other     PT REPORTS HX OF MALIGNANT HYPERTHERMIA WITH D&C 1980 -  ? MALIGNANT HYPERTHERMIA VS PSEUDOCHOLINESTERASE DEFICIENCY?  Marland Kitchen Prednisone     Pain/ broken blood vessels with high dose    Past Medical History, Surgical history, Social history, and Family History were reviewed and updated.   Physical Exam: Blood pressure 116/78, pulse 72, temperature 97.8 F (36.6 C), temperature source Oral, resp. rate 19, height 5\' 7"  (1.702 m), weight 130 lb 14.4 oz (59.376 kg), SpO2 100.00%. ECOG:  0 General appearance: alert and cooperative Head: Normocephalic, without obvious abnormality Neck: no adenopathy Lymph nodes: Cervical, supraclavicular, and axillary nodes normal. Heart:regular rate and rhythm, S1, S2 normal, no murmur, click, rub or gallop Lung:chest clear, no wheezing, rales, normal symmetric air entry.  Abdomin: soft, non-tender, without masses or organomegaly EXT:no erythema, induration, or nodules   Lab Results: Lab Results  Component Value Date   WBC 3.7* 02/28/2014   HGB 13.8 02/28/2014   HCT 42.8 02/28/2014   MCV 93.3 02/28/2014   PLT 161 02/28/2014     Chemistry      Component Value Date/Time   NA 139 02/21/2014 0846   NA 141 01/09/2014 1330   K 4.3 02/21/2014 0846   K 4.9 01/09/2014 1330   CL 103 01/09/2014 1330   CO2 25 02/21/2014 0846   CO2 28 01/09/2014 1330   BUN 13.9 02/21/2014 0846   BUN 18 01/09/2014 1330   CREATININE 0.8 02/21/2014 0846   CREATININE 0.97 01/09/2014 1330   CREATININE 0.77 07/11/2013 1652      Component Value Date/Time   CALCIUM 9.3 02/21/2014 0846   CALCIUM 10.2 01/09/2014 1330   ALKPHOS 79 02/21/2014 0846   ALKPHOS 84 07/11/2013 1652   AST 27 02/21/2014 0846   AST 21 07/11/2013 1652   ALT 21 02/21/2014 0846   ALT 11 07/11/2013 1652   BILITOT 0.53 02/21/2014 0846   BILITOT 0.4 07/11/2013 1652       Impression and Plan:  68 year old woman with the following issues:  1. Muscle invasive bladder cancer presented with T2 N0 disease without any evidence of metastasis. Patient refused definitive surgery at this time and opted to proceed with radiation therapy concomitantly with chemotherapy. She is status post 2 weeks of chemotherapy with radiation and ready to proceed with week 3 of carboplatin. Her laboratory data were reviewed and appear adequate.  2. IV access: We will use her peripheral veins for the time being. She continues to refuse a Port-A-Cath.  3. Antiemetics: She had a problem with Zofran with headaches and will use Compazine stent.  4.  Constipation: this have resolved and now has diarrhea which she uses Imodium.  5.  Followup: Will be in 2 weeks to assess for any complications. She will receive week 4 of therapy on 03/07/2014.  [facility], MD 9/16/20158:51 AM

## 2014-03-01 ENCOUNTER — Ambulatory Visit
Admission: RE | Admit: 2014-03-01 | Discharge: 2014-03-01 | Disposition: A | Discharge status: Home / Self Care | Payer: Medicare Other | Source: Ambulatory Visit | Attending: Radiation Oncology | Admitting: Radiation Oncology

## 2014-03-01 DIAGNOSIS — Z510 Encounter for antineoplastic radiation therapy: Secondary | ICD-10-CM | POA: Diagnosis not present

## 2014-03-02 ENCOUNTER — Ambulatory Visit: Payer: Medicare Other

## 2014-03-05 ENCOUNTER — Ambulatory Visit: Payer: Medicare Other

## 2014-03-06 ENCOUNTER — Ambulatory Visit
Admission: RE | Admit: 2014-03-06 | Discharge: 2014-03-06 | Disposition: A | Discharge status: Home / Self Care | Payer: Medicare Other | Source: Ambulatory Visit | Attending: Radiation Oncology | Admitting: Radiation Oncology

## 2014-03-06 ENCOUNTER — Encounter: Payer: Self-pay | Admitting: Radiation Oncology

## 2014-03-06 VITALS — BP 111/95 | HR 70 | Temp 98.2°F | Ht 67.0 in | Wt 131.7 lb

## 2014-03-06 DIAGNOSIS — C679 Malignant neoplasm of bladder, unspecified: Secondary | ICD-10-CM

## 2014-03-06 DIAGNOSIS — Z510 Encounter for antineoplastic radiation therapy: Secondary | ICD-10-CM | POA: Diagnosis not present

## 2014-03-06 NOTE — Progress Notes (Signed)
Sonya Banks has completed 14 fractions to her pelvis.  She denies pain, hematuria and dysuria.  She reports urinary frequency.  She reports having a "wierd stool" two days ago that she said looked like a jelly fish with a dark center.  She also reports having diarrhea yesterday.  She has Imodium if needed.  She reports darkness to her skin in her pelvic area.  She denies fatigue.

## 2014-03-06 NOTE — Progress Notes (Signed)
  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 . . . . . . . The patient presents for routine under treatment assessment.                                   The patient is without complaint except for diarrhea. Patient's take Imodium as needed for this issue. . She denies any significant dysuria.  No reports of hematuria.  Her energy level is good                                 Set-up films were reviewed.                                 The chart was checked. Physical Findings. . .  height is 5\' 7"  (1.702 m) and weight is 131 lb 11.2 oz (59.739 kg). Her oral temperature is 98.2 F (36.8 C). Her blood pressure is 111/95 and her pulse is 70. . Weight essentially stable.  No significant changes. Impression . . . . . . . The patient is tolerating radiation. Plan . . . . . . . . . . . . Continue treatment as planned.  ________________________________   Blair Promise, PhD, MD

## 2014-03-07 ENCOUNTER — Ambulatory Visit (HOSPITAL_BASED_OUTPATIENT_CLINIC_OR_DEPARTMENT_OTHER): Payer: Medicare HMO

## 2014-03-07 ENCOUNTER — Other Ambulatory Visit (HOSPITAL_BASED_OUTPATIENT_CLINIC_OR_DEPARTMENT_OTHER): Payer: Medicare HMO

## 2014-03-07 ENCOUNTER — Ambulatory Visit: Payer: Self-pay | Admitting: Internal Medicine

## 2014-03-07 ENCOUNTER — Ambulatory Visit
Admission: RE | Admit: 2014-03-07 | Discharge: 2014-03-07 | Disposition: A | Discharge status: Home / Self Care | Payer: Medicare Other | Source: Ambulatory Visit | Attending: Radiation Oncology | Admitting: Radiation Oncology

## 2014-03-07 VITALS — BP 133/92 | Temp 97.5°F | Resp 18

## 2014-03-07 DIAGNOSIS — C679 Malignant neoplasm of bladder, unspecified: Secondary | ICD-10-CM

## 2014-03-07 DIAGNOSIS — Z510 Encounter for antineoplastic radiation therapy: Secondary | ICD-10-CM | POA: Diagnosis not present

## 2014-03-07 DIAGNOSIS — Z5111 Encounter for antineoplastic chemotherapy: Secondary | ICD-10-CM

## 2014-03-07 LAB — COMPREHENSIVE METABOLIC PANEL (CC13)
ALK PHOS: 77 U/L (ref 40–150)
ALT: 21 U/L (ref 0–55)
AST: 27 U/L (ref 5–34)
Albumin: 3.5 g/dL (ref 3.5–5.0)
Anion Gap: 6 mEq/L (ref 3–11)
BILIRUBIN TOTAL: 0.57 mg/dL (ref 0.20–1.20)
BUN: 10.3 mg/dL (ref 7.0–26.0)
CO2: 27 mEq/L (ref 22–29)
Calcium: 9.8 mg/dL (ref 8.4–10.4)
Chloride: 107 mEq/L (ref 98–109)
Creatinine: 0.8 mg/dL (ref 0.6–1.1)
GLUCOSE: 79 mg/dL (ref 70–140)
Potassium: 4.3 mEq/L (ref 3.5–5.1)
SODIUM: 139 meq/L (ref 136–145)
Total Protein: 7.2 g/dL (ref 6.4–8.3)

## 2014-03-07 LAB — CBC WITH DIFFERENTIAL/PLATELET
BASO%: 0.7 % (ref 0.0–2.0)
Basophils Absolute: 0 10*3/uL (ref 0.0–0.1)
EOS%: 10.9 % — AB (ref 0.0–7.0)
Eosinophils Absolute: 0.3 10*3/uL (ref 0.0–0.5)
HCT: 40.4 % (ref 34.8–46.6)
HGB: 13 g/dL (ref 11.6–15.9)
LYMPH%: 16.1 % (ref 14.0–49.7)
MCH: 30 pg (ref 25.1–34.0)
MCHC: 32.2 g/dL (ref 31.5–36.0)
MCV: 93.2 fL (ref 79.5–101.0)
MONO#: 0.3 10*3/uL (ref 0.1–0.9)
MONO%: 8.7 % (ref 0.0–14.0)
NEUT#: 1.9 10*3/uL (ref 1.5–6.5)
NEUT%: 63.6 % (ref 38.4–76.8)
PLATELETS: 156 10*3/uL (ref 145–400)
RBC: 4.34 10*6/uL (ref 3.70–5.45)
RDW: 13.4 % (ref 11.2–14.5)
WBC: 3 10*3/uL — ABNORMAL LOW (ref 3.9–10.3)
lymph#: 0.5 10*3/uL — ABNORMAL LOW (ref 0.9–3.3)

## 2014-03-07 MED ORDER — DEXAMETHASONE SODIUM PHOSPHATE 10 MG/ML IJ SOLN
INTRAMUSCULAR | Status: AC
  Filled 2014-03-07: qty 1

## 2014-03-07 MED ORDER — CARBOPLATIN CHEMO INJECTION 450 MG/45ML
150.0000 mg | Freq: Once | INTRAVENOUS | Status: AC
  Administered 2014-03-07: 150 mg via INTRAVENOUS
  Filled 2014-03-07: qty 15

## 2014-03-07 MED ORDER — SODIUM CHLORIDE 0.9 % IV SOLN: Freq: Once | INTRAVENOUS | Status: DC

## 2014-03-07 MED ORDER — DEXAMETHASONE SODIUM PHOSPHATE 10 MG/ML IJ SOLN
10.0000 mg | Freq: Once | INTRAMUSCULAR | Status: AC
  Administered 2014-03-07: 10 mg via INTRAVENOUS

## 2014-03-07 NOTE — Patient Instructions (Addendum)
[place] Discharge Instructions for Patients Receiving Chemotherapy  Today you received the following chemotherapy agents Carboplatin  To help prevent nausea and vomiting after your treatment, we encourage you to take your nausea medication as needed   If you develop nausea and vomiting that is not controlled by your nausea medication, call the clinic.   BELOW ARE SYMPTOMS THAT SHOULD BE REPORTED IMMEDIATELY:  *FEVER GREATER THAN 100.5 F  *CHILLS WITH OR WITHOUT FEVER  NAUSEA AND VOMITING THAT IS NOT CONTROLLED WITH YOUR NAUSEA MEDICATION  *UNUSUAL SHORTNESS OF BREATH  *UNUSUAL BRUISING OR BLEEDING  TENDERNESS IN MOUTH AND THROAT WITH OR WITHOUT PRESENCE OF ULCERS  *URINARY PROBLEMS  *BOWEL PROBLEMS  UNUSUAL RASH Items with * indicate a potential emergency and should be followed up as soon as possible.  Feel free to call the clinic you have any questions or concerns. The clinic phone number is [phone].

## 2014-03-08 ENCOUNTER — Ambulatory Visit
Admission: RE | Admit: 2014-03-08 | Discharge: 2014-03-08 | Disposition: A | Discharge status: Home / Self Care | Payer: Medicare Other | Source: Ambulatory Visit | Attending: Radiation Oncology | Admitting: Radiation Oncology

## 2014-03-08 DIAGNOSIS — Z510 Encounter for antineoplastic radiation therapy: Secondary | ICD-10-CM | POA: Diagnosis not present

## 2014-03-09 ENCOUNTER — Ambulatory Visit
Admission: RE | Admit: 2014-03-09 | Discharge: 2014-03-09 | Disposition: A | Discharge status: Home / Self Care | Payer: Medicare Other | Source: Ambulatory Visit | Attending: Radiation Oncology | Admitting: Radiation Oncology

## 2014-03-09 DIAGNOSIS — Z510 Encounter for antineoplastic radiation therapy: Secondary | ICD-10-CM | POA: Diagnosis not present

## 2014-03-12 ENCOUNTER — Ambulatory Visit
Admission: RE | Admit: 2014-03-12 | Discharge: 2014-03-12 | Disposition: A | Discharge status: Home / Self Care | Payer: Medicare Other | Source: Ambulatory Visit | Attending: Radiation Oncology | Admitting: Radiation Oncology

## 2014-03-12 DIAGNOSIS — Z510 Encounter for antineoplastic radiation therapy: Secondary | ICD-10-CM | POA: Diagnosis not present

## 2014-03-13 ENCOUNTER — Ambulatory Visit
Admission: RE | Admit: 2014-03-13 | Discharge: 2014-03-13 | Disposition: A | Discharge status: Home / Self Care | Payer: Medicare Other | Source: Ambulatory Visit | Attending: Radiation Oncology | Admitting: Radiation Oncology

## 2014-03-13 ENCOUNTER — Encounter: Payer: Self-pay | Admitting: Radiation Oncology

## 2014-03-13 ENCOUNTER — Telehealth: Payer: Self-pay | Admitting: *Deleted

## 2014-03-13 VITALS — BP 102/75 | HR 78 | Temp 97.9°F | Resp 16 | Ht 67.0 in | Wt 132.0 lb

## 2014-03-13 DIAGNOSIS — Z510 Encounter for antineoplastic radiation therapy: Secondary | ICD-10-CM | POA: Diagnosis not present

## 2014-03-13 DIAGNOSIS — C679 Malignant neoplasm of bladder, unspecified: Secondary | ICD-10-CM

## 2014-03-13 NOTE — Progress Notes (Signed)
  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 . . . . . . . The patient presents for routine under treatment assessment.                                   The patient is without complaint.   She has had some mild problems with diarrhea and takes Imodium for this issue. This is also having some fatigue and her appetite is off somewhat. She has had some perirectal and vulvar irritation. I recommended she use a sitz bath.                                 Set-up films were reviewed.                                 The chart was checked. Physical Findings. . . the lungs are clear. The heart has a regular rhythm and rate. The abdomen is soft and nontender with normal bowel sounds. Impression . . . . . . . The patient is tolerating radiation. Plan . . . . . . . . . . . . Continue treatment as planned. The patient has 6 radiation treatments remaining. She will then be seen by Dr. Junious Silk and undergo repeat cystoscopy. If the bladder continues to be free of tumor then she will proceed with a definitive dose of radiation therapy.  ________________________________   Blair Promise, PhD, MD

## 2014-03-13 NOTE — Telephone Encounter (Signed)
Called patient to inform of appt.with Dr. Junious Silk on 03-28-14 @ 9:30 am, lvm for a return call

## 2014-03-13 NOTE — Progress Notes (Signed)
Patient reports diarrhea on Saturday until yesterday, twice a day.  She is taking imodium as needed.  She reports fatigue.  She reports a poor appetite and is trying to eat 4-5 small meals per day.  Her weight is stable.  She saw a "thread" of blood in her urine on Friday and called the triage nurse.  She was told to push fluids.  She reports nocturia x 2.  She reports an increase in urinary frequency.

## 2014-03-14 ENCOUNTER — Encounter: Payer: Self-pay | Admitting: Oncology

## 2014-03-14 ENCOUNTER — Ambulatory Visit (HOSPITAL_BASED_OUTPATIENT_CLINIC_OR_DEPARTMENT_OTHER): Payer: Medicare HMO | Admitting: Oncology

## 2014-03-14 ENCOUNTER — Ambulatory Visit
Admission: RE | Admit: 2014-03-14 | Discharge: 2014-03-14 | Disposition: A | Discharge status: Home / Self Care | Payer: Medicare Other | Source: Ambulatory Visit | Attending: Radiation Oncology | Admitting: Radiation Oncology

## 2014-03-14 ENCOUNTER — Telehealth: Payer: Self-pay | Admitting: Oncology

## 2014-03-14 ENCOUNTER — Other Ambulatory Visit (HOSPITAL_BASED_OUTPATIENT_CLINIC_OR_DEPARTMENT_OTHER): Payer: Medicare HMO

## 2014-03-14 ENCOUNTER — Ambulatory Visit (HOSPITAL_BASED_OUTPATIENT_CLINIC_OR_DEPARTMENT_OTHER): Payer: Medicare HMO

## 2014-03-14 VITALS — BP 128/71 | HR 72 | Temp 98.4°F | Resp 20 | Ht 67.0 in | Wt 132.0 lb

## 2014-03-14 DIAGNOSIS — C679 Malignant neoplasm of bladder, unspecified: Secondary | ICD-10-CM

## 2014-03-14 DIAGNOSIS — R197 Diarrhea, unspecified: Secondary | ICD-10-CM

## 2014-03-14 DIAGNOSIS — Z510 Encounter for antineoplastic radiation therapy: Secondary | ICD-10-CM | POA: Diagnosis not present

## 2014-03-14 DIAGNOSIS — C671 Malignant neoplasm of dome of bladder: Secondary | ICD-10-CM

## 2014-03-14 DIAGNOSIS — Z5111 Encounter for antineoplastic chemotherapy: Secondary | ICD-10-CM

## 2014-03-14 LAB — COMPREHENSIVE METABOLIC PANEL (CC13)
ALT: 21 U/L (ref 0–55)
ANION GAP: 7 meq/L (ref 3–11)
AST: 24 U/L (ref 5–34)
Albumin: 3.3 g/dL — ABNORMAL LOW (ref 3.5–5.0)
Alkaline Phosphatase: 76 U/L (ref 40–150)
BILIRUBIN TOTAL: 0.43 mg/dL (ref 0.20–1.20)
BUN: 11 mg/dL (ref 7.0–26.0)
CO2: 26 meq/L (ref 22–29)
CREATININE: 0.8 mg/dL (ref 0.6–1.1)
Calcium: 9.6 mg/dL (ref 8.4–10.4)
Chloride: 108 mEq/L (ref 98–109)
GLUCOSE: 82 mg/dL (ref 70–140)
Potassium: 4 mEq/L (ref 3.5–5.1)
Sodium: 141 mEq/L (ref 136–145)
Total Protein: 6.9 g/dL (ref 6.4–8.3)

## 2014-03-14 LAB — CBC WITH DIFFERENTIAL/PLATELET
BASO%: 0.4 % (ref 0.0–2.0)
Basophils Absolute: 0 10*3/uL (ref 0.0–0.1)
EOS%: 10.6 % — ABNORMAL HIGH (ref 0.0–7.0)
Eosinophils Absolute: 0.4 10*3/uL (ref 0.0–0.5)
HEMATOCRIT: 39.8 % (ref 34.8–46.6)
HGB: 13 g/dL (ref 11.6–15.9)
LYMPH%: 10.3 % — AB (ref 14.0–49.7)
MCH: 30.5 pg (ref 25.1–34.0)
MCHC: 32.6 g/dL (ref 31.5–36.0)
MCV: 93.5 fL (ref 79.5–101.0)
MONO#: 0.4 10*3/uL (ref 0.1–0.9)
MONO%: 10.8 % (ref 0.0–14.0)
NEUT#: 2.3 10*3/uL (ref 1.5–6.5)
NEUT%: 67.9 % (ref 38.4–76.8)
PLATELETS: 159 10*3/uL (ref 145–400)
RBC: 4.25 10*6/uL (ref 3.70–5.45)
RDW: 13.9 % (ref 11.2–14.5)
WBC: 3.4 10*3/uL — ABNORMAL LOW (ref 3.9–10.3)
lymph#: 0.3 10*3/uL — ABNORMAL LOW (ref 0.9–3.3)

## 2014-03-14 MED ORDER — SODIUM CHLORIDE 0.9 % IV SOLN
Freq: Once | INTRAVENOUS | Status: AC
  Administered 2014-03-14: 09:00:00 via INTRAVENOUS

## 2014-03-14 MED ORDER — SODIUM CHLORIDE 0.9 % IV SOLN
153.4000 mg | Freq: Once | INTRAVENOUS | Status: AC
  Administered 2014-03-14: 150 mg via INTRAVENOUS
  Filled 2014-03-14: qty 15

## 2014-03-14 MED ORDER — DEXAMETHASONE SODIUM PHOSPHATE 10 MG/ML IJ SOLN
10.0000 mg | Freq: Once | INTRAMUSCULAR | Status: AC
  Administered 2014-03-14: 10 mg via INTRAVENOUS

## 2014-03-14 MED ORDER — DEXAMETHASONE SODIUM PHOSPHATE 10 MG/ML IJ SOLN
INTRAMUSCULAR | Status: AC
  Filled 2014-03-14: qty 1

## 2014-03-14 NOTE — Telephone Encounter (Signed)
, °

## 2014-03-14 NOTE — Progress Notes (Signed)
Hematology and Oncology Follow Up Visit  Sonya Banks [id number] 08-19-1945 68 y.o. 03/14/2014 8:42 AM Sonya Banks, MDMcKeown, Sonya Saxon, MD   Principle Diagnosis: 68 year old woman diagnosed with muscle invasive bladder cancer in June of 2015 with clinical staging of T2 N0.    Prior Therapy: She is status post TURBT on June 20 13,015 without any residual tumors. She is status post repeat cystoscopy and biopsies obtained on 01/16/2014.  Current therapy: Radiation therapy concomitantly with weekly carboplatin. She is status post 4 weeks of treatment.  Interim History:  Sonya Banks presents today for a followup visit. Since her last visit, she underwent the first 4 weeks of chemotherapy with radiation therapy without any complications. She tolerated it with minimum side effects of causing slight fatigue and mild nausea. She is not reporting any abdominal pain or discomfort. But does report pelvic and vaginal irritation which has been manageable.. She does not report any headaches or blurry vision or double vision. Is that report any syncope or seizures. She does not report any chest pain or palpitation orthopnea. Does not report any shortness of breath or cough. She does not report any frequency urgency or hesitancy. She continues to have excellent performance status and quality of life. She is ready to proceed with chemotherapy. Rest of her review of systems unremarkable.  Medications: I have reviewed the patient's current medications.  Current Outpatient Prescriptions  Medication Sig Dispense Refill  . aspirin EC 81 MG tablet Take 81 mg by mouth daily.      Marland Kitchen buPROPion (WELLBUTRIN XL) 300 MG 24 hr tablet Take 300 mg by mouth every morning.      . Cholecalciferol (VITAMIN D PO) Take 5,000 Units by mouth daily.      Marland Kitchen EPINEPHrine (EPIPEN) 0.3 mg/0.3 mL IJ SOAJ injection Inject 0.3 mg into the muscle once as needed (Bee Sting).      . Flaxseed, Linseed, (FLAXSEED OIL) 1000 MG CAPS  Take 1,000 mg by mouth daily.       Marland Kitchen FLUoxetine (PROZAC) 20 MG capsule Take 20 mg by mouth every morning.       Nyoka Cowden Tea, Camillia sinensis, (GREEN TEA EXTRACT PO) Take 1 tablet by mouth daily.       Marland Kitchen LORazepam (ATIVAN) 2 MG tablet Take 1 mg by mouth at bedtime as needed for anxiety.       . Magnesium 250 MG TABS Take 250 mg by mouth daily.       . meloxicam (MOBIC) 7.5 MG tablet Take 7.5 mg by mouth daily.      . Multiple Vitamin (MULTIVITAMIN WITH MINERALS) TABS tablet Take 1 tablet by mouth daily.      . Omega-3 Fatty Acids (FISH OIL) 1000 MG CPDR Take 1,000 mg by mouth daily.       Marland Kitchen OVER THE COUNTER MEDICATION Take 1 tablet by mouth daily. Broccoli & Kale powder      . Probiotic Product (PROBIOTIC DAILY PO) Take 1 tablet by mouth daily.      .        . verapamil (CALAN-SR) 240 MG CR tablet Take 120 mg by mouth at bedtime as needed (when she is having cluster headaches).       No current facility-administered medications for this visit.     Allergies:  Allergies  Allergen Reactions  . Bee Venom Anaphylaxis  . Sulfa Antibiotics Swelling, Anaphylaxis, Hives and Shortness Of Breath     Starts Internally, hard to breath , with large patches  of hives.  . Ibuprofen Other (See Comments)    Patient's family has a history of kidney problems and MD stated not to take Ibuprofen.  . Latex     WHEN PT WEARS LATEX GLOVES - HANDS START ITCHING  . Other     PT REPORTS HX OF MALIGNANT HYPERTHERMIA WITH D&C 1980 -  ? MALIGNANT HYPERTHERMIA VS PSEUDOCHOLINESTERASE DEFICIENCY?  Marland Kitchen Prednisone     Pain/ broken blood vessels with high dose    Past Medical History, Surgical history, Social history, and Family History were reviewed and updated.   Physical Exam: Blood pressure 128/71, pulse 72, temperature 98.4 F (36.9 C), temperature source Oral, resp. rate 20, height 5\' 7"  (1.702 m), weight 132 lb (59.875 kg). ECOG: 0 General appearance: alert and cooperative not in any distress. Head:  Normocephalic, without obvious abnormality Neck: no adenopathy Lymph nodes: Cervical, supraclavicular, and axillary nodes normal. Heart:regular rate and rhythm, S1, S2 normal, no murmur, click, rub or gallop Lung:chest clear, no wheezing, rales, normal symmetric air entry.  Abdomin: soft, non-tender, without masses or organomegaly no rebound or guarding. EXT:no erythema, induration, or nodules Skin: No rashes or lesions  Lab Results: Lab Results  Component Value Date   WBC 3.4* 03/14/2014   HGB 13.0 03/14/2014   HCT 39.8 03/14/2014   MCV 93.5 03/14/2014   PLT 159 03/14/2014     Chemistry      Component Value Date/Time   NA 139 03/07/2014 1211   NA 141 01/09/2014 1330   K 4.3 03/07/2014 1211   K 4.9 01/09/2014 1330   CL 103 01/09/2014 1330   CO2 27 03/07/2014 1211   CO2 28 01/09/2014 1330   BUN 10.3 03/07/2014 1211   BUN 18 01/09/2014 1330   CREATININE 0.8 03/07/2014 1211   CREATININE 0.97 01/09/2014 1330   CREATININE 0.77 07/11/2013 1652      Component Value Date/Time   CALCIUM 9.8 03/07/2014 1211   CALCIUM 10.2 01/09/2014 1330   ALKPHOS 77 03/07/2014 1211   ALKPHOS 84 07/11/2013 1652   AST 27 03/07/2014 1211   AST 21 07/11/2013 1652   ALT 21 03/07/2014 1211   ALT 11 07/11/2013 1652   BILITOT 0.57 03/07/2014 1211   BILITOT 0.4 07/11/2013 1652       Impression and Plan:  68 year old woman with the following issues:  1. Muscle invasive bladder cancer presented with T2 N0 disease without any evidence of metastasis. Patient refused definitive surgery at this time and opted to proceed with radiation therapy concomitantly with chemotherapy. She is status post 4 weeks of chemotherapy with radiation and ready to proceed with week 5 of carboplatin which will be her last infusion. Her laboratory data were reviewed and appear adequate. She will complete her radiation therapy on October 7 and after that she will require restaging. I will set up a CT scan chest abdomen and pelvis to occur on the first  week of November followup at that time. She will also require a repeat cystoscopy as well.  2. IV access: We will use her peripheral veins for the time being. She continues to refuse a Port-A-Cath.  3. Antiemetics: She had a problem with Zofran with headaches and will use Compazine instead.  4. Constipation: this have resolved and now has diarrhea which she uses Imodium.  5.  Followup: In November after a repeat staging workup.  [facility], MD 9/30/20158:42 AM

## 2014-03-14 NOTE — Patient Instructions (Signed)
[place] Discharge Instructions for Patients Receiving Chemotherapy  Today you received the following chemotherapy agents Carboplatin  To help prevent nausea and vomiting after your treatment, we encourage you to take your nausea medication as needed   If you develop nausea and vomiting that is not controlled by your nausea medication, call the clinic.   BELOW ARE SYMPTOMS THAT SHOULD BE REPORTED IMMEDIATELY:  *FEVER GREATER THAN 100.5 F  *CHILLS WITH OR WITHOUT FEVER  NAUSEA AND VOMITING THAT IS NOT CONTROLLED WITH YOUR NAUSEA MEDICATION  *UNUSUAL SHORTNESS OF BREATH  *UNUSUAL BRUISING OR BLEEDING  TENDERNESS IN MOUTH AND THROAT WITH OR WITHOUT PRESENCE OF ULCERS  *URINARY PROBLEMS  *BOWEL PROBLEMS  UNUSUAL RASH Items with * indicate a potential emergency and should be followed up as soon as possible.  Feel free to call the clinic you have any questions or concerns. The clinic phone number is [phone].

## 2014-03-14 NOTE — Progress Notes (Signed)
Patient has "intolerance" to steroids - not allergy.  They keep her from sleeping. Patient refused flu vaccine today.

## 2014-03-15 ENCOUNTER — Ambulatory Visit
Admission: RE | Admit: 2014-03-15 | Discharge: 2014-03-15 | Disposition: A | Discharge status: Home / Self Care | Payer: Medicare HMO | Source: Ambulatory Visit | Attending: Radiation Oncology | Admitting: Radiation Oncology

## 2014-03-15 DIAGNOSIS — Z510 Encounter for antineoplastic radiation therapy: Secondary | ICD-10-CM | POA: Insufficient documentation

## 2014-03-15 DIAGNOSIS — I10 Essential (primary) hypertension: Secondary | ICD-10-CM | POA: Diagnosis not present

## 2014-03-15 DIAGNOSIS — C679 Malignant neoplasm of bladder, unspecified: Secondary | ICD-10-CM | POA: Insufficient documentation

## 2014-03-15 DIAGNOSIS — J449 Chronic obstructive pulmonary disease, unspecified: Secondary | ICD-10-CM | POA: Insufficient documentation

## 2014-03-15 DIAGNOSIS — E785 Hyperlipidemia, unspecified: Secondary | ICD-10-CM | POA: Insufficient documentation

## 2014-03-15 DIAGNOSIS — J440 Chronic obstructive pulmonary disease with acute lower respiratory infection: Secondary | ICD-10-CM | POA: Diagnosis not present

## 2014-03-15 DIAGNOSIS — J209 Acute bronchitis, unspecified: Secondary | ICD-10-CM | POA: Insufficient documentation

## 2014-03-16 ENCOUNTER — Ambulatory Visit
Admission: RE | Admit: 2014-03-16 | Discharge: 2014-03-16 | Disposition: A | Discharge status: Home / Self Care | Payer: Medicare HMO | Source: Ambulatory Visit | Attending: Radiation Oncology | Admitting: Radiation Oncology

## 2014-03-16 ENCOUNTER — Telehealth: Payer: Self-pay | Admitting: Medical Oncology

## 2014-03-16 DIAGNOSIS — Z510 Encounter for antineoplastic radiation therapy: Secondary | ICD-10-CM | POA: Diagnosis not present

## 2014-03-16 NOTE — Telephone Encounter (Signed)
Follow up call to patient's earlier call reporting to be having "sore throat and coughing up some greenish phlegm." Inquired with pt if she is running a fever or having chills, patient states she has not taken her temperature but has an appt today with radiation, denies chills and other symptoms. Advised pt to have her temp taken today during her radiation appt.   At radiation appt, pt's temp @ 98.7. Call to patient and informed her to contact her PCP for follow up for cold-like symptoms. Also advised pt should she run temp of 100.4, with or without chills, or greater that she needs to contact clinic. Patient gave verbal understanding. Denies questions at this time.

## 2014-03-19 ENCOUNTER — Ambulatory Visit
Admission: RE | Admit: 2014-03-19 | Discharge: 2014-03-19 | Disposition: A | Discharge status: Home / Self Care | Payer: Medicare HMO | Source: Ambulatory Visit | Attending: Radiation Oncology | Admitting: Radiation Oncology

## 2014-03-19 DIAGNOSIS — Z510 Encounter for antineoplastic radiation therapy: Secondary | ICD-10-CM | POA: Diagnosis not present

## 2014-03-20 ENCOUNTER — Encounter: Payer: Self-pay | Admitting: Radiation Oncology

## 2014-03-20 ENCOUNTER — Encounter: Payer: Self-pay | Admitting: Physician Assistant

## 2014-03-20 ENCOUNTER — Ambulatory Visit (INDEPENDENT_AMBULATORY_CARE_PROVIDER_SITE_OTHER): Payer: Medicare HMO | Admitting: Physician Assistant

## 2014-03-20 ENCOUNTER — Ambulatory Visit: Payer: Medicare Other

## 2014-03-20 ENCOUNTER — Ambulatory Visit
Admission: RE | Admit: 2014-03-20 | Discharge: 2014-03-20 | Disposition: A | Discharge status: Home / Self Care | Payer: Medicare HMO | Source: Ambulatory Visit | Attending: Radiation Oncology | Admitting: Radiation Oncology

## 2014-03-20 VITALS — BP 110/62 | HR 68 | Temp 98.0°F | Resp 16 | Ht 68.0 in | Wt 131.0 lb

## 2014-03-20 VITALS — BP 103/58 | HR 66 | Temp 98.0°F | Ht 67.0 in | Wt 131.6 lb

## 2014-03-20 DIAGNOSIS — J208 Acute bronchitis due to other specified organisms: Secondary | ICD-10-CM

## 2014-03-20 DIAGNOSIS — Z510 Encounter for antineoplastic radiation therapy: Secondary | ICD-10-CM | POA: Diagnosis not present

## 2014-03-20 DIAGNOSIS — C679 Malignant neoplasm of bladder, unspecified: Secondary | ICD-10-CM

## 2014-03-20 MED ORDER — BENZONATATE 100 MG PO CAPS: 100.0000 mg | ORAL_CAPSULE | Freq: Four times a day (QID) | ORAL | Status: DC | PRN

## 2014-03-20 MED ORDER — PHENAZOPYRIDINE HCL 200 MG PO TABS: 200.0000 mg | ORAL_TABLET | Freq: Three times a day (TID) | ORAL | Status: DC | PRN

## 2014-03-20 MED ORDER — AZITHROMYCIN 250 MG PO TABS: ORAL_TABLET | ORAL | Status: AC

## 2014-03-20 NOTE — Progress Notes (Signed)
   Subjective:    Patient ID: Sonya Banks, female    DOB: 02/23/1946, 68 y.o.   MRN: [id number]  Cough This is a new problem. Episode onset: 1 week. The cough is productive of purulent sputum. Associated symptoms include chills, postnasal drip, a sore throat and shortness of breath. Pertinent negatives include no chest pain, ear congestion, ear pain, fever, headaches, heartburn, hemoptysis, myalgias, nasal congestion, rash, rhinorrhea, sweats, weight loss or wheezing. The symptoms are aggravated by lying down. Risk factors for lung disease include smoking/tobacco exposure. Her past medical history is significant for COPD. Last chemo treatment was last wednesday, has last radiation treatment tomorrow    Review of Systems  Constitutional: Positive for chills and fatigue. Negative for fever and weight loss.  HENT: Positive for congestion, postnasal drip, sinus pressure and sore throat. Negative for dental problem, ear discharge, ear pain, nosebleeds, rhinorrhea, trouble swallowing and voice change.   Respiratory: Positive for cough, chest tightness and shortness of breath. Negative for hemoptysis and wheezing.   Cardiovascular: Negative.  Negative for chest pain.  Gastrointestinal: Negative.  Negative for heartburn.  Genitourinary: Negative.   Musculoskeletal: Negative.  Negative for myalgias.  Skin: Negative for rash.  Neurological: Negative.  Negative for headaches.       Objective:   Physical Exam  Constitutional: She is oriented to person, place, and time. She appears well-developed and well-nourished.  HENT:  Head: Normocephalic and atraumatic.  Right Ear: External ear normal.  Left Ear: External ear normal.  Nose: Nose normal.  Mouth/Throat: Oropharynx is clear and moist.  Eyes: Conjunctivae are normal. Pupils are equal, round, and reactive to light.  Neck: Normal range of motion. Neck supple.  Cardiovascular: Normal rate and regular rhythm.   Pulmonary/Chest: Effort normal.  No respiratory distress. She has wheezes. She has no rales. She exhibits no tenderness.  Abdominal: Soft. Bowel sounds are normal.  Lymphadenopathy:    She has cervical adenopathy.  Neurological: She is alert and oriented to person, place, and time.  Skin: Skin is warm and dry.       Assessment & Plan:  1. Acute bronchitis due to other specified organisms [J20.8] Go to the ER if any CP, SOB, nausea, dizziness, severe HA, changes vision/speech - azithromycin (ZITHROMAX) 250 MG tablet; Take 2 tablets (500 mg) on  Day 1,  followed by 1 tablet (250 mg) once daily on Days 2 through 5.  Dispense: 6 each; Refill: 1 - benzonatate (TESSALON PERLES) 100 MG capsule; Take 1 capsule (100 mg total) by mouth every 6 (six) hours as needed for cough.  Dispense: 90 capsule; Refill: 1

## 2014-03-20 NOTE — Progress Notes (Signed)
  Radiation Oncology         [phone] ________________________________  Name: Sonya Banks MRN: [id number]  Date: 03/20/2014  DOB: December 11, 1945  Weekly Radiation Therapy Management  Current Dose: 43.2 Gy     Planned Dose:  45+ Gy  Narrative . . . . . . . . The patient presents for routine under treatment assessment.                                   The patient is having urinary symptoms with burning and frequency. The patient will be given a prescription for Pyridium for this issue. She denies any hematuria.                                  Set-up films were reviewed.                                 The chart was checked. Physical Findings. . .  height is 5\' 7"  (1.702 m) and weight is 131 lb 9.6 oz (59.693 kg). Her oral temperature is 98 F (36.7 C). Her blood pressure is 103/58 and her pulse is 66. Her oxygen saturation is 99%. .  The lungs are clear. The heart has a regular rhythm and rate. The abdomen is soft and nontender with normal bowel sounds.  Impression . . . . . . . The patient is tolerating radiation. Plan . . . . . . . . . . . . Continue treatment as planned. Patient has 1 more remaining treatment. She will then proceed with cystoscopy.  Medical oncology has scheduled repeat imaging for early November. If the bladder is free of tumor she will then proceed with a definitive dose of radiation therapy, likely with additional radiosensitizing chemotherapy  ________________________________   Blair Promise, PhD, MD

## 2014-03-20 NOTE — Patient Instructions (Signed)

## 2014-03-20 NOTE — Progress Notes (Signed)
Sonya Banks has completed 24 fractions to her pelvis.  She reports pain from skin irritation and hemorrhoids in her rectal area.  She is using the sitz bath and using baby wipes.  She also reports having diarrhea on the weekend and that her bowel movement today was "skinny."  She reports having burning at the end of urination.  She reports having urinary frequency.  She finished chemotherapy last Wednesday.  She reports fatigue.  She denies having nausea.  She also has a cold and has been coughing up green sputum.  She has an appointment with her primary care doctor today.  She has been given a one month follow up card.

## 2014-03-21 ENCOUNTER — Ambulatory Visit
Admission: RE | Admit: 2014-03-21 | Discharge: 2014-03-21 | Disposition: A | Discharge status: Home / Self Care | Payer: Medicare Other | Source: Ambulatory Visit | Attending: Radiation Oncology | Admitting: Radiation Oncology

## 2014-03-21 DIAGNOSIS — Z510 Encounter for antineoplastic radiation therapy: Secondary | ICD-10-CM | POA: Diagnosis not present

## 2014-03-25 ENCOUNTER — Encounter: Payer: Self-pay | Admitting: Internal Medicine

## 2014-03-25 DIAGNOSIS — Z79899 Other long term (current) drug therapy: Secondary | ICD-10-CM | POA: Insufficient documentation

## 2014-03-25 NOTE — Progress Notes (Signed)
Patient ID: Sonya Banks, female   DOB: 04-10-1946, 68 y.o.   MRN: [id number]   MEDICARE ANNUAL WELLNESS VISIT AND F/U OV  Assessment:   1. Essential hypertension  - TSH  2. Hyperlipidemia  - Lipid panel  3. Prediabetes  - Hemoglobin A1c - Insulin, fasting  4. Vitamin D deficiency  - Vit D  25 hydroxy (rtn osteoporosis monitoring)  5. Medication management  - Magnesium - CBC with Differential  Plan:   During the course of the visit the patient was educated and counseled about appropriate screening and preventive services including:    Pneumococcal vaccine   Influenza vaccine  Td vaccine  Screening electrocardiogram  Bone densitometry screening  Colorectal cancer screening  Diabetes screening  Glaucoma screening  Nutrition counseling   Advanced directives: requested  Screening recommendations, referrals: Vaccinations: DT vaccine 2012 Influenza vaccine declined Pneumococcal vaccine 2013 Prevnar vaccine declined Shingles vaccine 04/26/2012 Hep B vaccine not indicated  Nutrition assessed and recommended  Colonoscopy declined Recommended yearly ophthalmology/optometry visit for glaucoma screening and checkup Recommended yearly dental visit for hygiene and checkup Advanced directives - No  Conditions/risks identified: BMI: Discussed weight loss, diet, and increase physical activity.  Increase physical activity: AHA recommends 150 minutes of physical activity a week.   Medications reviewed  Urinary Incontinence is not an issue: discussed non pharmacology and pharmacology options.  Fall risk: None - discussed PT, home fall assessment, medications.   Subjective:    Sonya Banks is a 68 y.o. female who presents for Medicare Annual Wellness Visit and 3 month follow up with Hx/o labile Hypertension, Hyperlipidemia and Vitamin D Deficiency. Date of last medicare wellness visit is unknown.    Patient has been treated for Bladder Cancer   Since June 2015 by Dr's  Dollene Primrose and  Kinard.   Patient is treated for Hx/o labile HTN treated in the past with Verapamil for concomitant migraine prophylaxis. BP has been controlled and today's 104/64 . Patient has had no complaints of any cardiac type chest pain, palpitations, dyspnea/orthopnea/PND, dizziness, claudication, or dependent edema. Her blood pressure has been controlled at home, & today their BP is BP: 106/64 mmHg She does not workout.    She is not on cholesterol medication and denies myalgias. Her cholesterol is at goal. The cholesterol last visit was:   Lab Results  Component Value Date   CHOL 173 07/11/2013   HDL 46 07/11/2013   TRIG 77 07/11/2013   CHOLHDL 3.8 07/11/2013    Also, the patient is screened for PreDiabetes and has had no symptoms of reactive hypoglycemia, diabetic polys, paresthesias or visual blurring.  Last A1c was  5.6% 07/11/2013. She has been working on diet and exercise for prediabetes, and denies foot ulcerations, hyperglycemia, paresthesia of the feet, polydipsia, polyuria and visual disturbances. Last A1C in the office was:  Lab Results  Component Value Date   HGBA1C 5.6 07/11/2013    Further, the patient also has history of Vitamin D Deficiency (41 in Nov 2013)and supplements vitamin D without any suspected side-effects. Last vitamin D was  52 on 07/11/2013.  Names of Other Physician/Practitioners you currently use: 1. [facility] here for primary care 2. None, eye doctor, Referred to Dr Delman Cheadle 3. None, dentist, Offered referral  Patient Care Team: Unk Pinto, MD as PCP - General (Internal Medicine) Festus Aloe, MD as Consulting Physician (Urology) Wyatt Portela, MD as Consulting Physician (Oncology) Blair Promise, MD as Consulting Physician (Radiation Oncology) Charlann Boxer  Delman Cheadle, MD as Consulting Physician (Ophthalmology)  Medication Review: Medication Sig  . aspirin EC 81 MG tablet Take 81  mg by mouth daily.  Marland Kitchen buPROPion   300 MG XL 24 hr tablet Take 300 mg by mouth every morning.  . Cholecalciferol (VITAMIN D PO) Take 5,000 Units by mouth daily.  Marland Kitchen EPIPEN) 0.3 mg/0.3 mL injection Inject 0.3 mg into the muscle once as needed (Bee Sting).  Marland Kitchen FLAXSEED OIL 1000 MG CAPS Take 1,000 mg by mouth daily.   Marland Kitchen FLUoxetine  40 MG capsule TAKE 1 CAPSULE BY MOUTH ONCE DAILY FOR MOOD  . GREEN TEA EXTRACT  Take 1 tablet by mouth daily.   Marland Kitchen loperamide  2 MG capsule Take 4 mg by mouth as needed for diarrhea or loose stools.  Marland Kitchen LORazepam 2 MG tablet Take 1 mg by mouth at bedtime as needed for anxiety.   . Magnesium 250 MG TABS Take 250 mg by mouth daily.   . meloxicam  7.5 MG tablet Take 7.5 mg by mouth daily.  . MULTIVIT WITH MINERALS Take 1 tablet by mouth daily.  Marland Kitchen FISH OIL) 1000 MG  Take 1,000 mg by mouth daily.   . ondansetron (ZOFRAN) 8 MG tablet Take 1 tablet  every 8  hours as needed  . OVER THE COUNTER MEDICATION Take 1 tablet by mouth daily. Broccoli & Kale powder  . phenazopyridine (PYRIDIUM) 200 MG tablet Take 1 tablet (200 mg total) by mouth 3 (three) times daily as needed for pain.  . Probiotic Product (PROBIOTIC DAILY PO) Take 1 tablet by mouth daily.  . prochlorperazine 10 MG  Take 1 tablet  every 6 (six) hours as needed for nausea or vomiting.  . verapamil SR 240 MG CR t Take  at bedtime as needed  cluster headaches  . Wound Cleansers (RADIAPLEX EX) Apply topically.   Current Problems (verified) Patient Active Problem List   Diagnosis Date Noted  . Medication management 03/25/2014  . Bladder cancer 12/26/2013  . Prediabetes 07/11/2013  . Vitamin D deficiency 07/11/2013  . Hypertension   . COPD (chronic obstructive pulmonary disease)   . Hyperlipidemia   . Migraine   . Fibromyalgia   . Depression    Screening Tests Health Maintenance  Topic Date Due  . Tetanus/tdap  11/12/1965  . Colonoscopy  11/12/1996  . Pneumococcal Polysaccharide Vaccine Age 8 And Over   11/13/2011  . Influenza Vaccine  01/13/2014  . Mammogram  08/10/2015  . Zostavax  Completed   Immunization History  Administered Date(s) Administered  . Zoster 04/26/2012   Preventative care: Last colonoscopy: Uncertain  Prior vaccinations: TD. 2012  Influenza: declined  Pneumococcal: 2013 Prevnar: declined Shingles/Zostavax: 2013  History reviewed: allergies, current medications, past family history, past medical history, past social history, past surgical history and problem list  Risk Factors: Tobacco History  Substance Use Topics  . Smoking status: Former Smoker -- 0.50 packs/day for 40 years    Types: Cigarettes    Quit date: 01/31/2014  . Smokeless tobacco: Never Used  . Alcohol Use: No     Comment: glass of wine every few weeks   She does not smoke (quit Sept 2015).  Patient is a former smoker. Are there smokers in your home (other than you)?  No  Alcohol Current alcohol use: none  Caffeine Current caffeine use: coffee 1 /day  Exercise Current exercise: walking  Nutrition/Diet Current diet: in general, a "healthy" diet    Cardiac risk factors: advanced age (older  than 37 for men, 20 for women) and smoking/ tobacco exposure.  Depression Screen (Note: if answer to either of the following is "Yes", a more complete depression screening is indicated)   Q1: Over the past two weeks, have you felt down, depressed or hopeless? No  Q2: Over the past two weeks, have you felt little interest or pleasure in doing things? No  Have you lost interest or pleasure in daily life? No  Do you often feel hopeless? No  Do you cry easily over simple problems? No  Activities of Daily Living In your present state of health, do you have any difficulty performing the following activities?:  Driving? No Managing money?  No Feeding yourself? No Getting from bed to chair? No Climbing a flight of stairs? No Preparing food and eating?: No Bathing or showering? No Getting  dressed: No Getting to the toilet? No Using the toilet:No Moving around from place to place: No In the past year have you fallen or had a near fall?:No   Are you sexually active?  No  Do you have more than one partner?  No  Vision Difficulties: No  Hearing Difficulties: No Do you often ask people to speak up or repeat themselves? No Do you experience ringing or noises in your ears? No Do you have difficulty understanding soft or whispered voices? Sometimes.  Cognition  Do you feel that you have a problem with memory?No  Do you often misplace items? No  Do you feel safe at home?  Yes  Advanced directives Does patient have a [place]? No Does patient have a Living Will? No  Objective:     Blood pressure 106/64, pulse 80, temperature 98 F (36.7 C), temperature source Temporal, resp. rate 18, height 5\' 8"  (1.727 m), weight 133 lb (60.328 kg). Body mass index is 20.23 kg/(m^2).  General appearance: alert, no distress, WD/WN, female Cognitive Testing  Alert? Yes  Normal Appearance?Yes  Oriented to person? Yes  Place? Yes   Time? Yes  Recall of three objects?  Yes  Can perform simple calculations? Yes  Displays appropriate judgment? Yes  Can read the correct time ?Yes  HEENT: normocephalic, sclerae anicteric, TMs pearly, nares patent, no discharge or erythema, pharynx normal Oral cavity: MMM, no lesions Neck: supple, no lymphadenopathy, no thyromegaly, no masses Heart: RRR, normal S1, S2, no murmurs Lungs: CTA bilaterally, no wheezes, rhonchi, or rales Abdomen: +bs, soft, non tender, non distended, no masses, no hepatomegaly, no splenomegaly Musculoskeletal: nontender, no swelling, no obvious deformity Extremities: no edema, no cyanosis, no clubbing Pulses: 2+ symmetric, upper and lower extremities, normal cap refill Neurological: alert, oriented x 3, CN2-12 intact, strength normal upper extremities and lower extremities, sensation normal throughout,  DTRs 2+ throughout, no cerebellar signs, gait normal Psychiatric: normal affect, behavior normal, pleasant   Medicare Attestation I have personally reviewed: The patient's medical and social history Their use of alcohol, tobacco or illicit drugs Their current medications and supplements The patient's functional ability including ADLs,fall risks, home safety risks, cognitive, and hearing and visual impairment Diet and physical activities Evidence for depression or mood disorders  The patient's weight, height, BMI, and visual acuity have been recorded in the chart.  I have made referrals, counseling, and provided education to the patient based on review of the above and I have provided the patient with a written personalized care plan for preventive services.    Zenobia Kuennen DAVID, MD   03/26/2014

## 2014-03-25 NOTE — Patient Instructions (Signed)

## 2014-03-26 ENCOUNTER — Telehealth: Payer: Self-pay | Admitting: Oncology

## 2014-03-26 ENCOUNTER — Encounter: Payer: Self-pay | Admitting: Internal Medicine

## 2014-03-26 ENCOUNTER — Ambulatory Visit (INDEPENDENT_AMBULATORY_CARE_PROVIDER_SITE_OTHER): Payer: Medicare HMO | Admitting: Internal Medicine

## 2014-03-26 VITALS — BP 106/64 | HR 80 | Temp 98.0°F | Resp 18 | Ht 68.0 in | Wt 133.0 lb

## 2014-03-26 DIAGNOSIS — F419 Anxiety disorder, unspecified: Secondary | ICD-10-CM | POA: Insufficient documentation

## 2014-03-26 DIAGNOSIS — E785 Hyperlipidemia, unspecified: Secondary | ICD-10-CM

## 2014-03-26 DIAGNOSIS — E559 Vitamin D deficiency, unspecified: Secondary | ICD-10-CM

## 2014-03-26 DIAGNOSIS — R7303 Prediabetes: Secondary | ICD-10-CM

## 2014-03-26 DIAGNOSIS — Z0001 Encounter for general adult medical examination with abnormal findings: Secondary | ICD-10-CM

## 2014-03-26 DIAGNOSIS — I10 Essential (primary) hypertension: Secondary | ICD-10-CM

## 2014-03-26 DIAGNOSIS — Z79899 Other long term (current) drug therapy: Secondary | ICD-10-CM

## 2014-03-26 DIAGNOSIS — R6889 Other general symptoms and signs: Secondary | ICD-10-CM

## 2014-03-26 NOTE — Telephone Encounter (Signed)
Sonya Banks called and said she starting having tenderness/pain in her left groin area on Friday.  She said she is also having pain in her left hip joint and is also feeling a clicking in the joint.  She also said that she is not able to fill the pyridium prescription because it is $56.00.  She does have an appointment with her primary care doctor today.  Advised her to let him know about the pain and Dr. Sondra Come will also be notified.

## 2014-03-27 ENCOUNTER — Telehealth: Payer: Self-pay | Admitting: Oncology

## 2014-03-27 LAB — CBC WITH DIFFERENTIAL/PLATELET
BASOS PCT: 1 % (ref 0–1)
Basophils Absolute: 0 10*3/uL (ref 0.0–0.1)
Eosinophils Absolute: 0.1 10*3/uL (ref 0.0–0.7)
Eosinophils Relative: 6 % — ABNORMAL HIGH (ref 0–5)
HCT: 34.4 % — ABNORMAL LOW (ref 36.0–46.0)
HEMOGLOBIN: 11.8 g/dL — AB (ref 12.0–15.0)
Lymphocytes Relative: 23 % (ref 12–46)
Lymphs Abs: 0.5 10*3/uL — ABNORMAL LOW (ref 0.7–4.0)
MCH: 30.8 pg (ref 26.0–34.0)
MCHC: 34.3 g/dL (ref 30.0–36.0)
MCV: 89.8 fL (ref 78.0–100.0)
MONOS PCT: 14 % — AB (ref 3–12)
Monocytes Absolute: 0.3 10*3/uL (ref 0.1–1.0)
NEUTROS ABS: 1.2 10*3/uL — AB (ref 1.7–7.7)
NEUTROS PCT: 56 % (ref 43–77)
PLATELETS: 260 10*3/uL (ref 150–400)
RBC: 3.83 MIL/uL — AB (ref 3.87–5.11)
RDW: 15.3 % (ref 11.5–15.5)
WBC: 2.2 10*3/uL — ABNORMAL LOW (ref 4.0–10.5)

## 2014-03-27 LAB — LIPID PANEL
CHOLESTEROL: 163 mg/dL (ref 0–200)
HDL: 47 mg/dL (ref >39)
LDL Cholesterol: 98 mg/dL (ref 0–99)
TRIGLYCERIDES: 88 mg/dL (ref <150)
Total CHOL/HDL Ratio: 3.5 Ratio
VLDL: 18 mg/dL (ref 0–40)

## 2014-03-27 LAB — HEMOGLOBIN A1C
Hgb A1c MFr Bld: 5.6 % (ref <5.7)
Mean Plasma Glucose: 114 mg/dL (ref <117)

## 2014-03-27 LAB — VITAMIN D 25 HYDROXY (VIT D DEFICIENCY, FRACTURES): Vit D, 25-Hydroxy: 67 ng/mL (ref 30–89)

## 2014-03-27 LAB — INSULIN, FASTING: Insulin fasting, serum: 7.6 u[IU]/mL (ref 2.0–19.6)

## 2014-03-27 LAB — TSH: TSH: 0.51 u[IU]/mL (ref 0.350–4.500)

## 2014-03-27 LAB — MAGNESIUM: Magnesium: 1.8 mg/dL (ref 1.5–2.5)

## 2014-03-27 NOTE — Telephone Encounter (Signed)
Sonya Banks reports the pain in her hip is better today.  Her primary care doctor saw it yesterday and said it may be irritation from radiation.  She did take a tramadol last night and is feeling much better today.  She does not think she needs to be seen.

## 2014-03-28 ENCOUNTER — Telehealth: Payer: Self-pay | Admitting: Oncology

## 2014-03-28 NOTE — Telephone Encounter (Signed)
Called Jasime and let her know that she can try Azo instead of pyridium per Dr. Sondra Come.  Advised her to call if it does not help.

## 2014-04-15 ENCOUNTER — Encounter: Payer: Self-pay | Admitting: Radiation Oncology

## 2014-04-15 NOTE — Progress Notes (Signed)
  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   She did experience some fatigue as well as some dysuria during the course of treatment  Plan: The patient has completed radiation treatment. The patient will proceed with cystoscopy and repeat imaging. If there is no residual disease noted at cystoscopy and staging workup is negative then the patient will then proceed with definitive dose of radiation therapy to the bladder to a dose of approximately 65 gray  -----------------------------------  Blair Promise, PhD, MD

## 2014-04-16 ENCOUNTER — Encounter: Payer: Self-pay | Admitting: Internal Medicine

## 2014-04-17 ENCOUNTER — Other Ambulatory Visit: Payer: Medicare HMO

## 2014-04-17 ENCOUNTER — Ambulatory Visit (HOSPITAL_COMMUNITY): Payer: Medicare HMO

## 2014-04-19 ENCOUNTER — Ambulatory Visit: Payer: Medicare HMO | Admitting: Oncology

## 2014-04-20 ENCOUNTER — Ambulatory Visit (HOSPITAL_COMMUNITY): Payer: Medicare HMO

## 2014-04-20 ENCOUNTER — Other Ambulatory Visit: Payer: Medicare Other

## 2014-04-25 ENCOUNTER — Ambulatory Visit: Payer: Medicare HMO | Admitting: Radiation Oncology

## 2014-04-26 ENCOUNTER — Encounter: Payer: Self-pay | Admitting: Oncology

## 2014-04-27 ENCOUNTER — Ambulatory Visit
Admission: RE | Admit: 2014-04-27 | Discharge: 2014-04-27 | Disposition: A | Discharge status: Home / Self Care | Payer: Medicare Other | Source: Ambulatory Visit | Attending: Radiation Oncology | Admitting: Radiation Oncology

## 2014-04-27 ENCOUNTER — Telehealth: Payer: Self-pay | Admitting: Radiation Oncology

## 2014-04-27 ENCOUNTER — Ambulatory Visit: Payer: Medicare HMO | Admitting: Oncology

## 2014-04-27 ENCOUNTER — Encounter: Payer: Self-pay | Admitting: Radiation Oncology

## 2014-04-27 ENCOUNTER — Other Ambulatory Visit: Payer: Self-pay | Admitting: Oncology

## 2014-04-27 VITALS — BP 114/71 | HR 71 | Temp 97.6°F | Resp 24 | Ht 68.0 in | Wt 134.9 lb

## 2014-04-27 DIAGNOSIS — C679 Malignant neoplasm of bladder, unspecified: Secondary | ICD-10-CM

## 2014-04-27 NOTE — Telephone Encounter (Signed)
Confirmed with patient that she has the contrast to drink before her CT appt on 05/03/14. She still had questions so transferred her to [place] at [phone].

## 2014-04-27 NOTE — Progress Notes (Addendum)
Sonya Banks here for follow up after treatment to her pelvis.  She reports pain/tightness starting in her abdominal area that does down to her upper thighs.  She said it started 2 weeks ago and is worse in the mornings.  She reports urinary urgency.  She also has noticed that her abdomen bloats when she had to urinate.  She reports constipation and is taking Senakot as needed, usually about once a week.  She reports hyperpigmentation in her groin area.  She is using radiaplex and would like a refill.  She reports fatigue that is improving.  She reports that Dr. Hazeline Junker nurse is scheduling a scan for her.  She had refused it and then changed her mind.

## 2014-04-27 NOTE — Progress Notes (Signed)
  Radiation Oncology         [phone] ________________________________  Name: Jenelle Drennon MRN: [id number]  Date: 04/27/2014  DOB: Feb 27, 1946  Follow-Up Visit Note  CC: MCKEOWN,WILLIAM DAVID, MD  Wyatt Portela, MD    ICD-9-CM ICD-10-CM   1. 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  The patient completed a preoperative dose of radiation therapy. Patient did undergo cystoscopy and apparently no recurrence within the bladder or residual lesion.  The patient canceled her CT scans of abdomen and pelvis earlier this month.  She does complain of tightness along her lower pelvis and upper thigh area and some joint stiffness since completion of her radiation. She denies any dysuria hematuria nausea or bowel complaints.                              ALLERGIES:  is allergic to bee venom; sulfa antibiotics; ibuprofen; latex; other; and prednisone.     Physical Findings: The patient is in no acute distress. Patient is alert and oriented.  height is 5\' 8"  (1.727 m) and weight is 134 lb 14.4 oz (61.19 kg). Her oral temperature is 97.6 F (36.4 C). Her blood pressure is 114/71 and her pulse is 71. Her respiration is 24. Marland Kitchen  No palpable subclavicular or axillary adenopathy. The lungs are clear to auscultation. The heart has a regular rhythm and rate. The abdomen is soft and nontender with normal bowel sounds.  Lab Findings: Lab Results  Component Value Date   WBC 2.2* 03/26/2014   HGB 11.8* 03/26/2014   HCT 34.4* 03/26/2014   MCV 89.8 03/26/2014   PLT 260 03/26/2014    Radiographic Findings: No results found.  Impression:  The patient is considering not continuing with additional radiation therapy. I discussed in  detail with her that a dose of 45 gray would not be adequate treatment for definitive radiation therapy.  The patient has reconsidered and wishes to proceed with her abdomen and pelvic CT scan which I have scheduled.  Plan:  tentative simulation after completion of the abdomen and pelvic CT scan. I anticipate approximately a week and half of radiation therapy directed at the bladder region assuming the patient agrees with additional radiation therapy.  ____________________________________ Blair Promise, MD

## 2014-04-27 NOTE — Addendum Note (Signed)
Encounter addended by: Jacqulyn Liner, RN on: 04/27/2014  2:33 PM<BR>     Documentation filed: Notes Section

## 2014-04-29 ENCOUNTER — Other Ambulatory Visit: Payer: Self-pay | Admitting: Radiation Oncology

## 2014-04-29 DIAGNOSIS — C679 Malignant neoplasm of bladder, unspecified: Secondary | ICD-10-CM

## 2014-04-30 ENCOUNTER — Telehealth: Payer: Self-pay | Admitting: Oncology

## 2014-04-30 NOTE — Telephone Encounter (Signed)
s.w pt and advised on NOV appt....pt ok adn aware °

## 2014-05-03 ENCOUNTER — Ambulatory Visit (HOSPITAL_COMMUNITY)
Admission: RE | Admit: 2014-05-03 | Discharge: 2014-05-03 | Disposition: A | Discharge status: Home / Self Care | Payer: Medicare HMO | Source: Ambulatory Visit | Attending: Radiation Oncology | Admitting: Radiation Oncology

## 2014-05-03 ENCOUNTER — Ambulatory Visit
Admission: RE | Admit: 2014-05-03 | Discharge: 2014-05-03 | Disposition: A | Discharge status: Home / Self Care | Payer: Medicare HMO | Source: Ambulatory Visit | Attending: Radiation Oncology | Admitting: Radiation Oncology

## 2014-05-03 ENCOUNTER — Encounter (HOSPITAL_COMMUNITY): Payer: Self-pay

## 2014-05-03 DIAGNOSIS — Z8551 Personal history of malignant neoplasm of bladder: Secondary | ICD-10-CM | POA: Diagnosis not present

## 2014-05-03 DIAGNOSIS — I7090 Unspecified atherosclerosis: Secondary | ICD-10-CM | POA: Diagnosis not present

## 2014-05-03 DIAGNOSIS — Z510 Encounter for antineoplastic radiation therapy: Secondary | ICD-10-CM | POA: Diagnosis not present

## 2014-05-03 DIAGNOSIS — Z9221 Personal history of antineoplastic chemotherapy: Secondary | ICD-10-CM | POA: Insufficient documentation

## 2014-05-03 DIAGNOSIS — K868 Other specified diseases of pancreas: Secondary | ICD-10-CM | POA: Insufficient documentation

## 2014-05-03 DIAGNOSIS — C679 Malignant neoplasm of bladder, unspecified: Secondary | ICD-10-CM

## 2014-05-03 DIAGNOSIS — Z90710 Acquired absence of both cervix and uterus: Secondary | ICD-10-CM | POA: Diagnosis not present

## 2014-05-03 DIAGNOSIS — K838 Other specified diseases of biliary tract: Secondary | ICD-10-CM | POA: Insufficient documentation

## 2014-05-03 LAB — BUN AND CREATININE (CC13)
BUN: 10.9 mg/dL (ref 7.0–26.0)
CREATININE: 0.8 mg/dL (ref 0.6–1.1)

## 2014-05-03 MED ORDER — IOHEXOL 300 MG/ML  SOLN
125.0000 mL | Freq: Once | INTRAMUSCULAR | Status: AC | PRN
  Administered 2014-05-03: 125 mL via INTRAVENOUS

## 2014-05-03 MED ORDER — IOHEXOL 300 MG/ML  SOLN: 100.0000 mL | Freq: Once | INTRAMUSCULAR | Status: DC | PRN

## 2014-05-04 ENCOUNTER — Telehealth: Payer: Self-pay | Admitting: Oncology

## 2014-05-04 ENCOUNTER — Ambulatory Visit (HOSPITAL_BASED_OUTPATIENT_CLINIC_OR_DEPARTMENT_OTHER): Payer: Medicare HMO | Admitting: Oncology

## 2014-05-04 VITALS — BP 107/64 | HR 86 | Temp 98.1°F | Resp 18 | Ht 68.0 in | Wt 133.9 lb

## 2014-05-04 DIAGNOSIS — C679 Malignant neoplasm of bladder, unspecified: Secondary | ICD-10-CM

## 2014-05-04 NOTE — Progress Notes (Signed)
Hematology and Oncology Follow Up Visit  Sonya Banks [id number] [birth year] 68 y.o. 05/04/2014 3:47 PM MCKEOWN,WILLIAM DAVID, MDMcKeown, [name], MD   Principle Diagnosis: 68 year old woman diagnosed with muscle invasive bladder cancer in June of 2015 with clinical staging of T2 N0.    Prior Therapy: She is status post TURBT on June 20 13,015 without any residual tumors. She is status post repeat cystoscopy and biopsies obtained on 01/16/2014. Radiation therapy concomitantly with weekly carboplatin. Therapy completed in October 2015   Current therapy:   Interim History:  [name] presents today for a followup visit. Since her last visit, she completed chemotherapy with radiation therapy without any complications. She tolerated it with minimum side effects. She is not reporting any residual complications at this time. She is not reporting any abdominal pain or discomfort. But does report pelvic and vaginal irritation which has been manageable. She does not report any headaches or blurry vision or double vision. Is that report any syncope or seizures. She does not report any chest pain or palpitation orthopnea. Does not report any shortness of breath or cough. She does not report any frequency urgency or hesitancy. She continues to have excellent performance status and quality of life. Rest of her review of systems unremarkable.  Medications: I have reviewed the patient's current medications.  Current Outpatient Prescriptions  Medication Sig Dispense Refill  . aspirin EC 81 MG tablet Take 81 mg by mouth daily.      Marland Kitchen buPROPion (WELLBUTRIN XL) 300 MG 24 hr tablet Take 300 mg by mouth every morning.      . Cholecalciferol (VITAMIN D PO) Take 5,000 Units by mouth daily.      Marland Kitchen EPINEPHrine (EPIPEN) 0.3 mg/0.3 mL IJ SOAJ injection Inject 0.3 mg into the muscle once as needed (Bee Sting).      . Flaxseed, Linseed, (FLAXSEED OIL) 1000 MG CAPS Take 1,000 mg by mouth daily.       Marland Kitchen  FLUoxetine (PROZAC) 20 MG capsule Take 20 mg by mouth every morning.       Nyoka Cowden Tea, Camillia sinensis, (GREEN TEA EXTRACT PO) Take 1 tablet by mouth daily.       Marland Kitchen LORazepam (ATIVAN) 2 MG tablet Take 1 mg by mouth at bedtime as needed for anxiety.       . Magnesium 250 MG TABS Take 250 mg by mouth daily.       . meloxicam (MOBIC) 7.5 MG tablet Take 7.5 mg by mouth daily.      . Multiple Vitamin (MULTIVITAMIN WITH MINERALS) TABS tablet Take 1 tablet by mouth daily.      . Omega-3 Fatty Acids (FISH OIL) 1000 MG CPDR Take 1,000 mg by mouth daily.       Marland Kitchen OVER THE COUNTER MEDICATION Take 1 tablet by mouth daily. Broccoli & Kale powder      . Probiotic Product (PROBIOTIC DAILY PO) Take 1 tablet by mouth daily.      .        . verapamil (CALAN-SR) 240 MG CR tablet Take 120 mg by mouth at bedtime as needed (when she is having cluster headaches).       No current facility-administered medications for this visit.     Allergies:  Allergies  Allergen Reactions  . Bee Venom Anaphylaxis  . Sulfa Antibiotics Swelling, Anaphylaxis, Hives and Shortness Of Breath     Starts Internally, hard to breath , with large patches of hives.  . Ibuprofen Other (See Comments)  Patient's family has a history of kidney problems and MD stated not to take Ibuprofen.  . Latex     WHEN PT WEARS LATEX GLOVES - HANDS START ITCHING  . Other     PT REPORTS HX OF MALIGNANT HYPERTHERMIA WITH D&C 1980 -  ? MALIGNANT HYPERTHERMIA VS PSEUDOCHOLINESTERASE DEFICIENCY?  Marland Kitchen Prednisone     Pain/ broken blood vessels with high dose    Past Medical History, Surgical history, Social history, and Family History were reviewed and updated.   Physical Exam: Blood pressure 107/64, pulse 86, temperature 98.1 F (36.7 C), temperature source Oral, resp. rate 18, height 5\' 8"  (1.727 m), weight 133 lb 14.4 oz (60.737 kg), SpO2 99 %. ECOG: 0 General appearance: alert and cooperative not in any distress. Head: Normocephalic, without  obvious abnormality Neck: no adenopathy Lymph nodes: Cervical, supraclavicular, and axillary nodes normal. Heart:regular rate and rhythm, S1, S2 normal, no murmur, click, rub or gallop Lung:chest clear, no wheezing, rales, normal symmetric air entry.  Abdomin: soft, non-tender, without masses or organomegaly no rebound or guarding. EXT:no erythema, induration, or nodules Skin: No rashes or lesions  Lab Results: Lab Results  Component Value Date   WBC 2.2* 03/26/2014   HGB 11.8* 03/26/2014   HCT 34.4* 03/26/2014   MCV 89.8 03/26/2014   PLT 260 03/26/2014     Chemistry      Component Value Date/Time   NA 141 03/14/2014 0819   NA 141 01/09/2014 1330   K 4.0 03/14/2014 0819   K 4.9 01/09/2014 1330   CL 103 01/09/2014 1330   CO2 26 03/14/2014 0819   CO2 28 01/09/2014 1330   BUN 10.9 05/03/2014 1046   BUN 18 01/09/2014 1330   CREATININE 0.8 05/03/2014 1046   CREATININE 0.97 01/09/2014 1330   CREATININE 0.77 07/11/2013 1652      Component Value Date/Time   CALCIUM 9.6 03/14/2014 0819   CALCIUM 10.2 01/09/2014 1330   ALKPHOS 76 03/14/2014 0819   ALKPHOS 84 07/11/2013 1652   AST 24 03/14/2014 0819   AST 21 07/11/2013 1652   ALT 21 03/14/2014 0819   ALT 11 07/11/2013 1652   BILITOT 0.43 03/14/2014 0819   BILITOT 0.4 07/11/2013 1652     EXAM: CT ABDOMEN AND PELVIS WITHOUT AND WITH CONTRAST  TECHNIQUE: Multidetector CT imaging of the abdomen and pelvis was performed following the standard protocol before and following the bolus administration of intravenous contrast.  CONTRAST: 158mL OMNIPAQUE IOHEXOL 300 MG/ML SOLN  COMPARISON: CT of the abdomen and pelvis 11/16/2013.  FINDINGS: Lower chest: Unremarkable.  Hepatobiliary: There is some very mild intrahepatic biliary ductal dilatation (unchanged compared to the prior study. No suspicious cystic or solid hepatic lesions. Common bile duct is normal in caliber measuring only 5 mm in the porta hepatis.  Gallbladder is unremarkable in appearance.  Pancreas: There is some mild pancreatic ductal dilatation measuring up to 7 mm with proximal body of the pancreas. No pancreatic ductal dilatation in the distal body and tail of the pancreas. Appears to be some mild side branch ectasia within the pancreatic head. The distal pancreatic duct is diminutive common appears to narrow on image 32 of series 4 shortly before the ampulla. However, no discrete pancreatic head mass is confidently identified at this time. No cystic or solid lesion is noted in the remainder the pancreas either.  Spleen: Unremarkable.  Adrenals/Urinary Tract: Precontrast images demonstrate no calcifications within the collecting system of either kidney, along the course of either ureter, or  within the lumen of the urinary bladder. Post contrast images demonstrate no focal solid renal lesions. Post contrast delayed images demonstrate no definite filling defects within the collecting system of either kidney, along the course of either ureter, or within the lumen of the urinary bladder to strongly suggest the presence of a urothelial neoplasm. Bilateral adrenal glands are normal in appearance.  Stomach/Bowel: Normal appearance of the stomach. No pathologic dilatation of small bowel or colon.  Vascular/Lymphatic: Atherosclerosis throughout the abdominal and pelvic vasculature, and some reservoir suction. No lymphadenopathy noted in the abdomen or pelvis.  Reproductive: Status post hysterectomy. Ovaries are not confidently identified may be surgically absent or atrophic.  Other: No significant volume of ascites. No pneumoperitoneum.  Musculoskeletal: There are no aggressive appearing lytic or blastic lesions noted in the visualized portions of the skeleton.  IMPRESSION: 1. No findings to suggest recurrence of bladder cancer on today's examination. 2. Mild intrahepatic biliary ductal dilatation is unchanged  compared to the prior examination. At this time, there is no associated common bile duct dilatation. Findings could be seen in setting of a bile duct stricture. No cholelithiasis or evidence of acute cholecystitis at this time. 3. In addition, on today's study, there is new dilatation of the pancreatic duct in the region of the pancreatic head and proximal body of the pancreas, as well as a small amount of side branch ectasia in this region. This abruptly tapers immediately before the ampulla. The possibility of the postinflammatory stricture distal pancreatic duct is suspected. No discrete pancreatic head mass is identified to suggest underlying malignancy on today's CT examination. Additionally, more proximal pancreatic duct in distal body and tail of the pancreas is not dilated at this time. 4. For further evaluation of the biliary tract dilatation and pancreatic ductal dilatation, consideration for further evaluation with ERCP is suggested. MRI/MRCP with and without IV gadolinium could be considered, however, no discrete lesion is identified on today's examination to strongly suggest a pancreatic head mass. 5. Additional incidental findings, as above.   Impression and Plan:  68 year old woman with the following issues:  1. Muscle invasive bladder cancer presented with T2 N0 disease without any evidence of metastasis. Patient refused definitive surgery at this time and opted to proceed with radiation therapy concomitantly with chemotherapy. She is radiation therapy concomitantly with carboplatin completed in October 2015. CT scan results on 05/03/2014 did not show any evidence of relapse disease. The plan is to continue with active surveillance with routine cystoscopies every 3 months and that has been done recently. We will repeat imaging studies in 6 months.  2. Bile duct and pancreatic duct dilation: Unclear etiology at this time her liver function tests and bilirubin is normal we  will continue to monitor this in future scans. I will consider MRI or ERCP if it persists.  3.  Followup: In 3 months.  Zola Button, MD 11/20/20153:47 PM

## 2014-05-04 NOTE — Telephone Encounter (Signed)
gv pt appt schedule for feb °

## 2014-05-29 ENCOUNTER — Ambulatory Visit: Admission: RE | Admit: 2014-05-29 | Payer: Medicare HMO | Source: Ambulatory Visit

## 2014-05-29 ENCOUNTER — Encounter: Payer: Self-pay | Admitting: Radiation Oncology

## 2014-05-29 ENCOUNTER — Ambulatory Visit: Admission: RE | Admit: 2014-05-29 | Payer: Medicare HMO | Source: Ambulatory Visit | Admitting: Radiation Oncology

## 2014-05-29 NOTE — Progress Notes (Signed)
  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   She did experience some fatigue during the course of treatment as well as some perirectal and vulvar irritation. She also experienced mild problems with diarrhea as well as bladder irritability. This responded well to Pyridium.  Plan: the patient proceeded to undergo cystoscopy after her pre-operative dose of radiation therapy. Fortunately there was no visible tumor within the bladder.  CT scan of abdomen and pelvis showed no residual bladder tumor or lymphadenopathy within the abdomen or pelvis. The patient was felt to be a candidate for bladder preservation and it was recommended she proceed with a boost to the bladder. After careful consideration,  the patient decided not to proceed with this additional radiation therapy despite conversations with the patient and her son.  The patient will followup with urology on a regular basis for cystoscopies. She will also followup on regular basis with medical oncology with blood work and frequent CT scans. I have offered followup appointments with the patient but she would like to simplify her followup schedule and have no scheduled followups in radiation oncology. Patient understands that she can call at any time for questions or to schedule a followup. I ask that medical oncology and urology keep me  informed of the patient's progress.  -----------------------------------  Blair Promise, PhD, MD

## 2014-05-29 NOTE — Progress Notes (Signed)
   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 She was scheduled today for simulation to begin her boost directed at the bladder. The patient called to cancel her simulation appointment. She is decided against proceeding with additional radiation therapy. Patient complains of stiffness in her hips and attributes this to her prior radiation therapy. The patient's bladder symptoms have improved significantly.  Today I discussed with patient that the recommendations are to proceed with a boost directed at the bladder to give her the best chances for cure. Patient understands this recommendation and at this time does not wish to proceed with additional radiation therapy. Today I also talked with the patient's son Lowry Bowl and we discussed the potential benefit of additional radiation therapy to enhance the chances for cure. Mr. Hollie Salk supports his mother's decision not to proceed with additional radiation therapy.  I also discussed the abnormal findings on the patient's most recent CT scan of abdomen and pelvis revealing a new finding of pancreatic ductal dilation in the region of the pancreatic head and proximal body of the pancreas. Recommendations are for ERCP for further evaluation. I discussed this with the patient and her son today. Patient does not wish to proceed with this procedure but is willing to continue CT scans every 3 months for followup of this issue.  Since patient will receive no further radiation therapy I recommended she come in for routine followup in one month. In light of the patient's close followup with urology and medical oncology the patient does not wish to proceed with followup in radiation oncology but understands the importance of frequent cystoscopies  and CT scans as part of her followup procedure.  -----------------------------------  Blair Promise, PhD, MD

## 2014-07-11 ENCOUNTER — Ambulatory Visit: Payer: Medicare HMO | Admitting: Internal Medicine

## 2014-07-11 NOTE — Progress Notes (Signed)
Patient ID: Sonya Banks, female   DOB: [birth year], 69 y.o.   MRN: [id number]  R E S C HE D U L E D

## 2014-08-02 ENCOUNTER — Other Ambulatory Visit: Payer: Medicare HMO

## 2014-08-02 ENCOUNTER — Ambulatory Visit: Payer: Medicare HMO | Admitting: Oncology

## 2014-09-05 ENCOUNTER — Ambulatory Visit (INDEPENDENT_AMBULATORY_CARE_PROVIDER_SITE_OTHER): Payer: Medicare HMO | Admitting: Internal Medicine

## 2014-09-05 ENCOUNTER — Other Ambulatory Visit: Payer: Self-pay | Admitting: *Deleted

## 2014-09-05 ENCOUNTER — Encounter: Payer: Self-pay | Admitting: Internal Medicine

## 2014-09-05 VITALS — BP 126/80 | HR 64 | Temp 97.2°F | Resp 16 | Ht 67.5 in | Wt 135.4 lb

## 2014-09-05 DIAGNOSIS — Z9181 History of falling: Secondary | ICD-10-CM

## 2014-09-05 DIAGNOSIS — Z1212 Encounter for screening for malignant neoplasm of rectum: Secondary | ICD-10-CM

## 2014-09-05 DIAGNOSIS — Z79899 Other long term (current) drug therapy: Secondary | ICD-10-CM

## 2014-09-05 DIAGNOSIS — R7309 Other abnormal glucose: Secondary | ICD-10-CM

## 2014-09-05 DIAGNOSIS — R7303 Prediabetes: Secondary | ICD-10-CM

## 2014-09-05 DIAGNOSIS — J449 Chronic obstructive pulmonary disease, unspecified: Secondary | ICD-10-CM

## 2014-09-05 DIAGNOSIS — E559 Vitamin D deficiency, unspecified: Secondary | ICD-10-CM

## 2014-09-05 DIAGNOSIS — F32A Depression, unspecified: Secondary | ICD-10-CM

## 2014-09-05 DIAGNOSIS — Z1331 Encounter for screening for depression: Secondary | ICD-10-CM

## 2014-09-05 DIAGNOSIS — I10 Essential (primary) hypertension: Secondary | ICD-10-CM

## 2014-09-05 DIAGNOSIS — E785 Hyperlipidemia, unspecified: Secondary | ICD-10-CM

## 2014-09-05 DIAGNOSIS — M797 Fibromyalgia: Secondary | ICD-10-CM

## 2014-09-05 DIAGNOSIS — F329 Major depressive disorder, single episode, unspecified: Secondary | ICD-10-CM

## 2014-09-05 DIAGNOSIS — N182 Chronic kidney disease, stage 2 (mild): Secondary | ICD-10-CM

## 2014-09-05 LAB — CBC WITH DIFFERENTIAL/PLATELET
BASOS ABS: 0 10*3/uL (ref 0.0–0.1)
Basophils Relative: 1 % (ref 0–1)
EOS PCT: 2 % (ref 0–5)
Eosinophils Absolute: 0.1 10*3/uL (ref 0.0–0.7)
HEMATOCRIT: 41.2 % (ref 36.0–46.0)
HEMOGLOBIN: 13.7 g/dL (ref 12.0–15.0)
LYMPHS ABS: 1.3 10*3/uL (ref 0.7–4.0)
Lymphocytes Relative: 35 % (ref 12–46)
MCH: 30.6 pg (ref 26.0–34.0)
MCHC: 33.3 g/dL (ref 30.0–36.0)
MCV: 92 fL (ref 78.0–100.0)
MPV: 9.6 fL (ref 8.6–12.4)
Monocytes Absolute: 0.4 10*3/uL (ref 0.1–1.0)
Monocytes Relative: 10 % (ref 3–12)
Neutro Abs: 2 10*3/uL (ref 1.7–7.7)
Neutrophils Relative %: 52 % (ref 43–77)
Platelets: 284 10*3/uL (ref 150–400)
RBC: 4.48 MIL/uL (ref 3.87–5.11)
RDW: 13.3 % (ref 11.5–15.5)
WBC: 3.8 10*3/uL — ABNORMAL LOW (ref 4.0–10.5)

## 2014-09-05 LAB — TSH: TSH: 0.877 u[IU]/mL (ref 0.350–4.500)

## 2014-09-05 LAB — BASIC METABOLIC PANEL WITH GFR
BUN: 13 mg/dL (ref 6–23)
CO2: 26 mEq/L (ref 19–32)
Calcium: 9.9 mg/dL (ref 8.4–10.5)
Chloride: 106 mEq/L (ref 96–112)
Creat: 0.73 mg/dL (ref 0.50–1.10)
GFR, EST NON AFRICAN AMERICAN: 85 mL/min
GFR, Est African American: >89 mL/min
Glucose, Bld: 103 mg/dL — ABNORMAL HIGH (ref 70–99)
Potassium: 4.6 mEq/L (ref 3.5–5.3)
Sodium: 139 mEq/L (ref 135–145)

## 2014-09-05 LAB — HEPATIC FUNCTION PANEL
ALBUMIN: 4.1 g/dL (ref 3.5–5.2)
ALK PHOS: 83 U/L (ref 39–117)
ALT: 14 U/L (ref 0–35)
AST: 22 U/L (ref 0–37)
BILIRUBIN INDIRECT: 0.5 mg/dL (ref 0.2–1.2)
BILIRUBIN TOTAL: 0.6 mg/dL (ref 0.2–1.2)
Bilirubin, Direct: 0.1 mg/dL (ref 0.0–0.3)
TOTAL PROTEIN: 7.1 g/dL (ref 6.0–8.3)

## 2014-09-05 LAB — LIPID PANEL
Cholesterol: 195 mg/dL (ref 0–200)
HDL: 51 mg/dL (ref >=46)
LDL Cholesterol: 135 mg/dL — ABNORMAL HIGH (ref 0–99)
TRIGLYCERIDES: 46 mg/dL (ref <150)
Total CHOL/HDL Ratio: 3.8 Ratio
VLDL: 9 mg/dL (ref 0–40)

## 2014-09-05 LAB — MAGNESIUM: Magnesium: 1.8 mg/dL (ref 1.5–2.5)

## 2014-09-05 MED ORDER — MELOXICAM 15 MG PO TABS
15.0000 mg | ORAL_TABLET | Freq: Every day | ORAL | Status: DC
Start: 2014-09-05 — End: 2015-01-04

## 2014-09-05 MED ORDER — EPINEPHRINE 0.3 MG/0.3ML IJ SOAJ: 0.3000 mg | Freq: Once | INTRAMUSCULAR | Status: DC | PRN

## 2014-09-05 NOTE — Patient Instructions (Signed)
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
## 2014-09-06 LAB — URINALYSIS, MICROSCOPIC ONLY
Bacteria, UA: NONE SEEN
Casts: NONE SEEN
Crystals: NONE SEEN
SQUAMOUS EPITHELIAL / LPF: NONE SEEN

## 2014-09-06 LAB — MICROALBUMIN / CREATININE URINE RATIO
Creatinine, Urine: 73.6 mg/dL
Microalb Creat Ratio: 5.4 mg/g (ref 0.0–30.0)
Microalb, Ur: 0.4 mg/dL (ref <2.0)

## 2014-09-06 LAB — VITAMIN D 25 HYDROXY (VIT D DEFICIENCY, FRACTURES): Vit D, 25-Hydroxy: 52 ng/mL (ref 30–100)

## 2014-09-06 LAB — HEMOGLOBIN A1C
HEMOGLOBIN A1C: 5.4 % (ref <5.7)
Mean Plasma Glucose: 108 mg/dL (ref <117)

## 2014-09-06 LAB — INSULIN, RANDOM: Insulin: 10 u[IU]/mL (ref 2.0–19.6)

## 2014-10-04 ENCOUNTER — Telehealth: Payer: Self-pay | Admitting: *Deleted

## 2014-10-04 NOTE — Telephone Encounter (Signed)
Pt aware of lab results 

## 2014-11-14 ENCOUNTER — Ambulatory Visit: Payer: Self-pay | Admitting: Internal Medicine

## 2014-12-10 ENCOUNTER — Other Ambulatory Visit: Payer: Self-pay

## 2015-01-04 ENCOUNTER — Ambulatory Visit (HOSPITAL_COMMUNITY)
Admission: RE | Admit: 2015-01-04 | Discharge: 2015-01-04 | Disposition: A | Discharge status: Home / Self Care | Payer: Medicare HMO | Source: Ambulatory Visit | Attending: Physician Assistant | Admitting: Physician Assistant

## 2015-01-04 ENCOUNTER — Ambulatory Visit (INDEPENDENT_AMBULATORY_CARE_PROVIDER_SITE_OTHER): Payer: Medicare HMO | Admitting: Physician Assistant

## 2015-01-04 VITALS — BP 102/60 | HR 72 | Temp 97.9°F | Resp 16 | Ht 68.0 in | Wt 139.4 lb

## 2015-01-04 DIAGNOSIS — E2839 Other primary ovarian failure: Secondary | ICD-10-CM

## 2015-01-04 DIAGNOSIS — M25532 Pain in left wrist: Secondary | ICD-10-CM

## 2015-01-04 MED ORDER — MELOXICAM 15 MG PO TABS: ORAL_TABLET | ORAL | Status: DC
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
## 2015-01-04 NOTE — Patient Instructions (Signed)
Radial Fracture °You have a broken bone (fracture) of the forearm. This is the part of your arm between the elbow and your wrist. Your forearm is made up of two bones. These are the radius and ulna. Your fracture is in the radial shaft. This is the bone in your forearm located on the thumb side. A cast or splint is used to protect and keep your injured bone from moving. The cast or splint will be on generally for about 5 to 6 weeks, with individual variations. °HOME CARE INSTRUCTIONS  °· Keep the injured part elevated while sitting or lying down. Keep the injury above the level of your heart (the center of the chest). This will decrease swelling and pain. °· Apply ice to the injury for 15-20 minutes, 03-04 times per day while awake, for 2 days. Put the ice in a plastic bag and place a towel between the bag of ice and your cast or splint. °· Move your fingers to avoid stiffness and minimize swelling. °· If you have a plaster or fiberglass cast: °¨ Do not try to scratch the skin under the cast using sharp or pointed objects. °¨ Check the skin around the cast every day. You may put lotion on any red or sore areas. °¨ Keep your cast dry and clean. °· If you have a plaster splint: °¨ Wear the splint as directed. °¨ You may loosen the elastic around the splint if your fingers become numb, tingle, or turn cold or blue. °¨ Do not put pressure on any part of your cast or splint. It may break. Rest your cast only on a pillow for the first 24 hours until it is fully hardened. °· Your cast or splint can be protected during bathing with a plastic bag. Do not lower the cast or splint into water. °· Only take over-the-counter or prescription medicines for pain, discomfort, or fever as directed by your caregiver. °SEEK IMMEDIATE MEDICAL CARE IF:  °· Your cast gets damaged or breaks. °· You have more severe pain or swelling than you did before getting the cast. °· You have severe pain when stretching your fingers. °· There is a bad  smell, new stains and/or pus-like (purulent) drainage coming from under the cast. °· Your fingers or hand turn pale or blue and become cold or your loose feeling. °Document Released: 11/12/2005 Document Revised: 08/24/2011 Document Reviewed: 02/08/2006 °[employer]® Patient Information ©2015 [employer]. This information is not intended to replace advice given to you by your health care provider. Make sure you discuss any questions you have with your health care provider. ° °

## 2015-01-30 ENCOUNTER — Ambulatory Visit (INDEPENDENT_AMBULATORY_CARE_PROVIDER_SITE_OTHER): Payer: Medicare HMO | Admitting: Physician Assistant

## 2015-01-30 ENCOUNTER — Encounter: Payer: Self-pay | Admitting: Physician Assistant

## 2015-01-30 VITALS — BP 132/70 | HR 76 | Temp 97.5°F | Resp 16 | Ht 68.0 in | Wt 137.2 lb

## 2015-01-30 DIAGNOSIS — G43919 Migraine, unspecified, intractable, without status migrainosus: Secondary | ICD-10-CM | POA: Diagnosis not present

## 2015-01-30 MED ORDER — DEXAMETHASONE SODIUM PHOSPHATE 100 MG/10ML IJ SOLN
10.0000 mg | Freq: Once | INTRAMUSCULAR | Status: AC
  Administered 2015-01-30: 10 mg via INTRAMUSCULAR

## 2015-01-30 MED ORDER — SUMATRIPTAN 20 MG/ACT NA SOLN: 20.0000 mg | NASAL | Status: AC | PRN

## 2015-01-30 MED ORDER — VERAPAMIL HCL ER 240 MG PO TBCR
240.0000 mg | EXTENDED_RELEASE_TABLET | Freq: Every day | ORAL | Status: AC
Start: 2015-01-30 — End: ?
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
## 2015-01-30 NOTE — Patient Instructions (Signed)
Cluster Headache Cluster headaches are recognized by their pattern of deep, intense head pain. They normally occur on one side of your head, but they may "switch sides" in subsequent episodes. Typically, cluster headaches:   Are severe in nature.   Occur repeatedly over weeks to months and are followed by periods of no headaches.   Can last from 15 minutes to 3 hours.   Occur at the same time each day, often at night.   Occur several times a day. CAUSES The exact cause of cluster headaches is not known. Alcohol use may be associated with cluster headaches. SIGNS AND SYMPTOMS   Severe pain that begins in or around your eye or temple.   One-sided head pain.   Feeling sick to your stomach (nauseous).   Sensitivity to light.   Runny nose.   Eye redness, tearing, and nasal stuffiness on the side of your head where you are experiencing pain.   Sweaty, pale skin of the face.   Droopy or swollen eyelid.   Restlessness. DIAGNOSIS  Cluster headaches are diagnosed based on symptoms and a physical exam. Your health care provider may order a CT scan or an MRI of your head or lab tests to see if your headaches are caused by other medical conditions.  TREATMENT   Medicines for pain relief and to prevent recurrent attacks. Some people may need a combination of medicines.  Oxygen for pain relief.   Biofeedback programs to help reduce headache pain.  It may be helpful to keep a headache diary. This may help you find a trend for what is triggering your headaches. Your health care provider can develop a treatment plan.  HOME CARE INSTRUCTIONS  During cluster periods:   Follow a regular sleep schedule. Do not vary the amount and time that you sleep from day to day. It is important to stay on the same schedule during a cluster period to help prevent headaches.   Avoid alcohol.   Stop smoking if you smoke.  SEEK MEDICAL CARE IF:  You have any changes from your previous  cluster headaches either in intensity or frequency.   You are not getting relief from medicines you are taking.  SEEK IMMEDIATE MEDICAL CARE IF:   You faint.   You have weakness or numbness, especially on one side of your body or face.   You have double vision.   You have nausea or vomiting that is not relieved within several hours.   You cannot keep your balance or have difficulty talking or walking.   You have neck pain or stiffness.   You have a fever. MAKE SURE YOU:  Understand these instructions.   Will watch your condition.   Will get help right away if you are not doing well or get worse. Document Released: 06/01/2005 Document Revised: 03/22/2013 Document Reviewed: 12/22/2012 Exit[employer] Patient Information 2015 [employer]. This information is not intended to replace advice given to you by your health care provider. Make sure you discuss any questions you have with your health care provider.  

## 2015-02-01 ENCOUNTER — Ambulatory Visit: Payer: Self-pay | Admitting: Physician Assistant

## 2015-02-05 ENCOUNTER — Other Ambulatory Visit: Payer: Medicare HMO

## 2015-04-03 ENCOUNTER — Ambulatory Visit: Payer: Medicare HMO | Admitting: Internal Medicine

## 2015-04-03 NOTE — Progress Notes (Signed)
Patient ID: Sonya Banks, female   DOB: [birth year], 69 y.o.   MRN: [id number]  Sonya Banks

## 2015-07-05 ENCOUNTER — Encounter: Payer: Self-pay | Admitting: *Deleted

## 2015-07-29 ENCOUNTER — Encounter: Payer: Self-pay | Admitting: Internal Medicine

## 2015-09-26 ENCOUNTER — Encounter: Payer: Self-pay | Admitting: Internal Medicine

## 2015-11-14 ENCOUNTER — Encounter: Payer: Self-pay | Admitting: Internal Medicine

## 2015-11-14 ENCOUNTER — Ambulatory Visit (INDEPENDENT_AMBULATORY_CARE_PROVIDER_SITE_OTHER): Payer: Medicare Other | Admitting: Internal Medicine

## 2015-11-14 VITALS — BP 106/68 | HR 64 | Temp 97.3°F | Resp 16 | Ht 67.5 in | Wt 140.4 lb

## 2015-11-14 DIAGNOSIS — I10 Essential (primary) hypertension: Secondary | ICD-10-CM | POA: Diagnosis not present

## 2015-11-14 DIAGNOSIS — E785 Hyperlipidemia, unspecified: Secondary | ICD-10-CM

## 2015-11-14 DIAGNOSIS — Z136 Encounter for screening for cardiovascular disorders: Secondary | ICD-10-CM | POA: Diagnosis not present

## 2015-11-14 DIAGNOSIS — R7303 Prediabetes: Secondary | ICD-10-CM

## 2015-11-14 DIAGNOSIS — Z0001 Encounter for general adult medical examination with abnormal findings: Secondary | ICD-10-CM

## 2015-11-14 DIAGNOSIS — Z0000 Encounter for general adult medical examination without abnormal findings: Secondary | ICD-10-CM | POA: Diagnosis not present

## 2015-11-14 DIAGNOSIS — Z79899 Other long term (current) drug therapy: Secondary | ICD-10-CM

## 2015-11-14 DIAGNOSIS — E559 Vitamin D deficiency, unspecified: Secondary | ICD-10-CM

## 2015-11-14 DIAGNOSIS — Z1212 Encounter for screening for malignant neoplasm of rectum: Secondary | ICD-10-CM

## 2015-11-14 LAB — CBC WITH DIFFERENTIAL/PLATELET
BASOS ABS: 0 {cells}/uL (ref 0–200)
Basophils Relative: 0 %
Eosinophils Absolute: 177 cells/uL (ref 15–500)
Eosinophils Relative: 3 %
HCT: 43.1 % (ref 35.0–45.0)
Hemoglobin: 14.1 g/dL (ref 11.7–15.5)
LYMPHS PCT: 31 %
Lymphs Abs: 1829 cells/uL (ref 850–3900)
MCH: 29.9 pg (ref 27.0–33.0)
MCHC: 32.7 g/dL (ref 32.0–36.0)
MCV: 91.5 fL (ref 80.0–100.0)
MONO ABS: 413 {cells}/uL (ref 200–950)
MPV: 9.6 fL (ref 7.5–12.5)
Monocytes Relative: 7 %
NEUTROS PCT: 59 %
Neutro Abs: 3481 cells/uL (ref 1500–7800)
Platelets: 301 10*3/uL (ref 140–400)
RBC: 4.71 MIL/uL (ref 3.80–5.10)
RDW: 12.9 % (ref 11.0–15.0)
WBC: 5.9 10*3/uL (ref 3.8–10.8)

## 2015-11-14 LAB — HEPATIC FUNCTION PANEL
ALT: 13 U/L (ref 6–29)
AST: 21 U/L (ref 10–35)
Albumin: 4.2 g/dL (ref 3.6–5.1)
Alkaline Phosphatase: 88 U/L (ref 33–130)
BILIRUBIN DIRECT: 0.1 mg/dL (ref <=0.2)
Indirect Bilirubin: 0.4 mg/dL (ref 0.2–1.2)
TOTAL PROTEIN: 6.9 g/dL (ref 6.1–8.1)
Total Bilirubin: 0.5 mg/dL (ref 0.2–1.2)

## 2015-11-14 LAB — BASIC METABOLIC PANEL WITH GFR
BUN: 9 mg/dL (ref 7–25)
CHLORIDE: 106 mmol/L (ref 98–110)
CO2: 26 mmol/L (ref 20–31)
CREATININE: 0.86 mg/dL (ref 0.50–0.99)
Calcium: 9.8 mg/dL (ref 8.6–10.4)
GFR, EST AFRICAN AMERICAN: 80 mL/min (ref >=60)
GFR, EST NON AFRICAN AMERICAN: 69 mL/min (ref >=60)
GLUCOSE: 90 mg/dL (ref 65–99)
Potassium: 4.1 mmol/L (ref 3.5–5.3)
SODIUM: 141 mmol/L (ref 135–146)

## 2015-11-14 LAB — LIPID PANEL
CHOL/HDL RATIO: 3.5 ratio (ref <=5.0)
CHOLESTEROL: 195 mg/dL (ref 125–200)
HDL: 55 mg/dL (ref >=46)
LDL Cholesterol: 126 mg/dL (ref <130)
Triglycerides: 69 mg/dL (ref <150)
VLDL: 14 mg/dL (ref <30)

## 2015-11-14 LAB — MAGNESIUM: Magnesium: 2 mg/dL (ref 1.5–2.5)

## 2015-11-14 LAB — HEMOGLOBIN A1C
HEMOGLOBIN A1C: 5.4 % (ref <5.7)
Mean Plasma Glucose: 108 mg/dL

## 2015-11-14 LAB — TSH: TSH: 1.71 mIU/L

## 2015-11-14 NOTE — Patient Instructions (Signed)
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
## 2015-11-15 LAB — MICROALBUMIN / CREATININE URINE RATIO

## 2015-11-15 LAB — URINALYSIS, ROUTINE W REFLEX MICROSCOPIC

## 2015-11-15 LAB — VITAMIN D 25 HYDROXY (VIT D DEFICIENCY, FRACTURES): Vit D, 25-Hydroxy: 43 ng/mL (ref 30–100)

## 2015-11-15 LAB — INSULIN, RANDOM: Insulin: 45.3 u[IU]/mL — ABNORMAL HIGH (ref 2.0–19.6)

## 2015-11-20 ENCOUNTER — Other Ambulatory Visit: Payer: Self-pay | Admitting: *Deleted

## 2015-11-20 DIAGNOSIS — M25532 Pain in left wrist: Secondary | ICD-10-CM

## 2015-11-20 MED ORDER — MELOXICAM 15 MG PO TABS: ORAL_TABLET | ORAL | Status: AC

## 2015-11-21 ENCOUNTER — Other Ambulatory Visit: Payer: Self-pay | Admitting: Internal Medicine

## 2016-01-15 ENCOUNTER — Encounter: Payer: Self-pay | Admitting: Physician Assistant

## 2016-01-15 ENCOUNTER — Ambulatory Visit (INDEPENDENT_AMBULATORY_CARE_PROVIDER_SITE_OTHER): Payer: Medicare Other | Admitting: Physician Assistant

## 2016-01-15 VITALS — BP 110/72 | HR 83 | Temp 97.7°F | Resp 16 | Ht 67.5 in | Wt 142.0 lb

## 2016-01-15 DIAGNOSIS — R7303 Prediabetes: Secondary | ICD-10-CM

## 2016-01-15 DIAGNOSIS — J441 Chronic obstructive pulmonary disease with (acute) exacerbation: Secondary | ICD-10-CM

## 2016-01-15 DIAGNOSIS — F17200 Nicotine dependence, unspecified, uncomplicated: Secondary | ICD-10-CM

## 2016-01-15 DIAGNOSIS — Z79899 Other long term (current) drug therapy: Secondary | ICD-10-CM

## 2016-01-15 DIAGNOSIS — F3342 Major depressive disorder, recurrent, in full remission: Secondary | ICD-10-CM | POA: Diagnosis not present

## 2016-01-15 DIAGNOSIS — G43919 Migraine, unspecified, intractable, without status migrainosus: Secondary | ICD-10-CM | POA: Diagnosis not present

## 2016-01-15 DIAGNOSIS — E785 Hyperlipidemia, unspecified: Secondary | ICD-10-CM

## 2016-01-15 DIAGNOSIS — I700 Atherosclerosis of aorta: Secondary | ICD-10-CM

## 2016-01-15 DIAGNOSIS — M797 Fibromyalgia: Secondary | ICD-10-CM

## 2016-01-15 DIAGNOSIS — E559 Vitamin D deficiency, unspecified: Secondary | ICD-10-CM

## 2016-01-15 DIAGNOSIS — N182 Chronic kidney disease, stage 2 (mild): Secondary | ICD-10-CM | POA: Diagnosis not present

## 2016-01-15 DIAGNOSIS — Z0001 Encounter for general adult medical examination with abnormal findings: Secondary | ICD-10-CM | POA: Diagnosis not present

## 2016-01-15 DIAGNOSIS — E2839 Other primary ovarian failure: Secondary | ICD-10-CM

## 2016-01-15 DIAGNOSIS — J449 Chronic obstructive pulmonary disease, unspecified: Secondary | ICD-10-CM | POA: Diagnosis not present

## 2016-01-15 DIAGNOSIS — I10 Essential (primary) hypertension: Secondary | ICD-10-CM

## 2016-01-15 DIAGNOSIS — C679 Malignant neoplasm of bladder, unspecified: Secondary | ICD-10-CM | POA: Diagnosis not present

## 2016-01-15 DIAGNOSIS — F419 Anxiety disorder, unspecified: Secondary | ICD-10-CM

## 2016-01-15 DIAGNOSIS — R6889 Other general symptoms and signs: Secondary | ICD-10-CM

## 2016-01-15 DIAGNOSIS — Z0000 Encounter for general adult medical examination without abnormal findings: Secondary | ICD-10-CM

## 2016-01-15 MED ORDER — FLUTICASONE FUROATE-VILANTEROL 100-25 MCG/INH IN AEPB: 1.0000 | INHALATION_SPRAY | Freq: Every day | RESPIRATORY_TRACT | 0 refills | Status: AC

## 2016-01-15 MED ORDER — PREDNISONE 20 MG PO TABS: ORAL_TABLET | ORAL | 0 refills | Status: DC

## 2016-01-15 MED ORDER — ALBUTEROL SULFATE HFA 108 (90 BASE) MCG/ACT IN AERS: 2.0000 | INHALATION_SPRAY | Freq: Four times a day (QID) | RESPIRATORY_TRACT | 2 refills | Status: AC | PRN

## 2016-01-15 MED ORDER — AZITHROMYCIN 250 MG PO TABS: ORAL_TABLET | ORAL | 1 refills | Status: AC

## 2016-01-15 NOTE — Progress Notes (Signed)
MEDICARE ANNUAL WELLNESS VISIT AND FOLLOW UP  Assessment:    Essential hypertension - continue medications, DASH diet, exercise and monitor at home. Call if greater than 130/80.  -     DG Chest 2 View; Future  Atherosclerosis of abdominal aorta (HCC) Control blood pressure, cholesterol, glucose, increase exercise.  Stop smoking  Chronic obstructive pulmonary disease, unspecified COPD type ([place]) Advised to stop smoking, will get CXR, continue meds.  -     fluticasone furoate-vilanterol (BREO ELLIPTA) 100-25 MCG/INH AEPB; Inhale 1 puff into the lungs daily. Rinse mouth with water after each use  Intractable migraine without status migrainosus, unspecified migraine type Avoid triggers  Fibromyalgia Continue meds, increase exercise  Malignant neoplasm of urinary bladder, unspecified site [facility]) Continue follow up  Chronic kidney disease (CKD), stage II (mild) Increase fluids, avoid NSAIDS, monitor sugars, will monitor  Hyperlipidemia -continue medications, check lipids, decrease fatty foods, increase activity.   Recurrent major depressive disorder, in full remission ([place]) Depression- continue medications, stress management techniques discussed, increase water, good sleep hygiene discussed, increase exercise, and increase veggies.   Prediabetes Discussed general issues about diabetes pathophysiology and management., Educational material distributed., Suggested low cholesterol diet., Encouraged aerobic exercise., Discussed foot care., Reminded to get yearly retinal exam.  Vitamin D deficiency Continue supplement  Medication management Continue supplement  Anxiety continue medications, stress management techniques discussed, increase water, good sleep hygiene discussed, increase exercise, and increase veggies.   Encounter for Medicare annual wellness exam Discussed that Colon cancer is 3rd most diagnosed cancer and 2nd leading cause of death in both men and women 22 years of  age and older despite being one of the most preventable and treatable cancers if found early. Despite this information patient still declines colonoscopy and understands risk of cancer and death, willing to do cologuard - schedule MGM and DEXA  Tobacco use disorder Smoking cessation-  instruction/counseling given, counseled patient on the dangers of tobacco use, advised patient to stop smoking, and reviewed strategies to maximize success, patient not ready to quit at this time.   COPD exacerbation ([place]) -     DG Chest 2 View; Future -     azithromycin (ZITHROMAX) 250 MG tablet; Take 2 tablets (500 mg) on  Day 1,  followed by 1 tablet (250 mg) once daily on Days 2 through 5. -     predniSONE (DELTASONE) 20 MG tablet; 1 tablet daily for 5 days. -     albuterol (PROVENTIL HFA;VENTOLIN HFA) 108 (90 Base) MCG/ACT inhaler; Inhale 2 puffs into the lungs every 6 (six) hours as needed for wheezing or shortness of breath.  Estrogen deficiency -     DG Bone Density; Future    Over 30 minutes of exam, counseling, chart review, and critical decision making was performed  Future Appointments Date Time Provider [place]  05/19/2016 10:30 AM Starlyn Skeans, PA-C [facility] None  12/11/2016 10:00 AM Unk Pinto, MD [facility] None    Plan:   During the course of the visit the patient was educated and counseled about appropriate screening and preventive services including:    Pneumococcal vaccine   Influenza vaccine  Td vaccine  Prevnar 13  Screening electrocardiogram  Screening mammography  Bone densitometry screening  Colorectal cancer screening  Diabetes screening  Glaucoma screening  Nutrition counseling   Advanced directives: given info/requested copies   Subjective:   Sonya Banks is a 70 y.o. female who presents for Medicare Annual Wellness Visit and acute visit.   She has a  history of bladder cancer in 2015, s/p treatment by Dr's  Dollene Primrose  and  Kinard. Her blood pressure has been controlled at home, today their BP is BP: 110/72 She does not workout. She denies chest pain, shortness of breath, dizziness.  She has a history of COPD, continues to smoke, not ready to quit, she states she has some wheezing and shortness of breath last night with green mucus last night.   She is not on cholesterol medication and denies myalgias. Her cholesterol is at goal. The cholesterol last visit was:   Lab Results  Component Value Date   CHOL 195 11/14/2015   HDL 55 11/14/2015   LDLCALC 126 11/14/2015   TRIG 69 11/14/2015   CHOLHDL 3.5 11/14/2015    She has been working on diet and exercise for prediabetes, and denies paresthesia of the feet, polydipsia, polyuria and visual disturbances. Last A1C in the office was:  Lab Results  Component Value Date   HGBA1C 5.4 11/14/2015   Lab Results  Component Value Date   GFRAA 80 11/14/2015   She is on prozac and wellbutrin for depression, is in remission.  Patient is on Vitamin D supplement. Lab Results  Component Value Date   VD25OH 43 11/14/2015      Medication Review Current Outpatient Prescriptions on File Prior to Visit  Medication Sig Dispense Refill  . aspirin EC 81 MG tablet Take 81 mg by mouth daily.    Marland Kitchen buPROPion (WELLBUTRIN XL) 300 MG 24 hr tablet Take 300 mg by mouth every morning.    . Cholecalciferol (VITAMIN D PO) Take 5,000 Units by mouth daily. Reported on 11/14/2015    . FLUoxetine (PROZAC) 40 MG capsule TAKE 1 CAPSULE BY MOUTH ONCE DAILY FOR MOOD 90 capsule 1  . Green Tea, Camillia sinensis, (GREEN TEA EXTRACT PO) Take 1 tablet by mouth daily.     . meloxicam (MOBIC) 15 MG tablet Take one daily with food for 2 weeks, can take with tylenol, can not take with aleve, iburpofen, then as needed daily for pain 30 tablet 1  . Multiple Vitamin (MULTIVITAMIN WITH MINERALS) TABS tablet Take 1 tablet by mouth daily.    . Omega-3 Fatty Acids (FISH OIL) 1000 MG CPDR Take 1,000 mg by  mouth daily.     Marland Kitchen OVER THE COUNTER MEDICATION Take 1 tablet by mouth daily. Broccoli & Kale powder    . SUMAtriptan (IMITREX) 20 MG/ACT nasal spray Place 1 spray (20 mg total) into the nose every 2 (two) hours as needed for migraine or headache. May repeat in 2 hours if headache persists or recurs. 5 Inhaler 1  . verapamil (CALAN-SR) 240 MG CR tablet Take 1 tablet (240 mg total) by mouth at bedtime. 30 tablet 5   No current facility-administered medications on file prior to visit.     Allergies: Allergies  Allergen Reactions  . Bee Venom Anaphylaxis  . Sulfa Antibiotics Swelling, Anaphylaxis, Hives and Shortness Of Breath     Starts Internally, hard to breath , with large patches of hives.  . Ibuprofen Other (See Comments)    Patient's family has a history of kidney problems and MD stated not to take Ibuprofen.  . Latex     WHEN PT WEARS LATEX GLOVES - HANDS START ITCHING  . Other     PT REPORTS HX OF MALIGNANT HYPERTHERMIA WITH D&C 1980 -  ? MALIGNANT HYPERTHERMIA VS PSEUDOCHOLINESTERASE DEFICIENCY?  Marland Kitchen Prednisone     Pain/ broken blood vessels with  high dose  . Zofran [Ondansetron Hcl]     Severe headache    Current Problems (verified) has Hypertension; COPD (chronic obstructive pulmonary disease) ([place]); Hyperlipidemia; Migraine; Fibromyalgia; Major depression in full remission ([place]); Prediabetes; Vitamin D deficiency; Bladder cancer ([place]); Medication management; Anxiety; Chronic kidney disease (CKD), stage II (mild); Encounter for Medicare annual wellness exam; Atherosclerosis of abdominal aorta ([place]); and Tobacco use disorder on her problem list.  Screening Tests Immunization History  Administered Date(s) Administered  . Zoster 04/26/2012    Preventative care: Last colonoscopy: has been more than 10 years, will do cologuard Last mammogram: 2015 Last pap smear/pelvic exam: remote, declines  DEXA: never CXR 11/2013  Prior vaccinations: TD or Tdap: 2012  Influenza:  declines Pneumococcal: 2013 Prevnar13: will get next Ov when not sick Shingles/Zostavax: 2013  Names of Other Physician/Practitioners you currently use: 1. [facility]- here for primary care 2. Dr. ?, eye doctor, last visit 1 years, states she is due, glasses Patient Care Team: Unk Pinto, MD as PCP - General (Internal Medicine) Festus Aloe, MD as Consulting Physician (Urology) Wyatt Portela, MD as Consulting Physician (Oncology) Gery Pray, MD as Consulting Physician (Radiation Oncology) Sharyne Peach, MD as Consulting Physician (Ophthalmology)  Surgical: She  has a past surgical history that includes Cystectomy; Hernia repair; Dilation and curettage of uterus (1980); Transurethral resection of bladder tumor (N/A, 12/05/2013); Cyst removal trunk; Abdominal hysterectomy; and Transurethral resection of bladder tumor (N/A, 01/16/2014). Family Her family history includes Asthma in her sister; Heart attack in her brother; Hypertension in her father and mother; Kidney disease in her sister; Stroke in her mother. Social history  She reports that she has been smoking Cigarettes.  She has a 20.00 pack-year smoking history. She has never used smokeless tobacco. She reports that she does not drink alcohol or use drugs.  MEDICARE WELLNESS OBJECTIVES: Physical activity: Current Exercise Habits: The patient does not participate in regular exercise at present Cardiac risk factors: Cardiac Risk Factors include: advanced age (>26men, >64 women);dyslipidemia;hypertension;sedentary lifestyle;smoking/ tobacco exposure Depression/mood screen:   Depression screen [facility] 2/9 01/15/2016  Decreased Interest 0  Down, Depressed, Hopeless 0  PHQ - 2 Score 0  Altered sleeping -  Tired, decreased energy -  Change in appetite -  Feeling bad or failure about yourself  -  Trouble concentrating -  Moving slowly or fidgety/restless -  Suicidal thoughts -  PHQ-9 Score -     ADLs:  In your present state of health, do you have any difficulty performing the following activities: 01/15/2016 11/14/2015  Hearing? N N  Vision? N N  Difficulty concentrating or making decisions? N N  Walking or climbing stairs? N N  Dressing or bathing? N N  Doing errands, shopping? N N  Some recent data might be hidden    Cognitive Testing  Alert? Yes  Normal Appearance?Yes  Oriented to person? Yes  Place? Yes   Time? Yes  Recall of three objects?  Yes  Can perform simple calculations? Yes  Displays appropriate judgment?Yes  Can read the correct time from a watch face?Yes  EOL planning: Does patient have an advance directive?: No Would patient like information on creating an advanced directive?: Yes - Educational materials given   Objective:   Today's Vitals   01/15/16 1549  BP: 110/72  Pulse: 83  Resp: 16  Temp: 97.7 F (36.5 C)  TempSrc: Temporal  SpO2: 98%  Weight: 142 lb (64.4 kg)  Height: 5' 7.5" (1.715 m)  PainSc: 0-No pain   Body mass index is 21.91 kg/m.  General appearance: alert, no distress, WD/WN,  female HEENT: normocephalic, sclerae anicteric, TMs pearly, nares patent, no discharge or erythema, pharynx normal Oral cavity: MMM, no lesions Neck: supple, no lymphadenopathy, no thyromegaly, no masses Heart: RRR, normal S1, S2, no murmurs Lungs: CTA bilaterally, no wheezes, rhonchi, or rales Abdomen: +bs, soft, non tender, non distended, no masses, no hepatomegaly, no splenomegaly Musculoskeletal: nontender, no swelling, no obvious deformity Extremities: no edema, no cyanosis, no clubbing Pulses: 2+ symmetric, upper and lower extremities, normal cap refill Neurological: alert, oriented x 3, CN2-12 intact, strength normal upper extremities and lower extremities, sensation normal throughout, DTRs 2+ throughout, no cerebellar signs, gait normal Psychiatric: normal affect, behavior normal, pleasant  Breast: defer Gyn: defer Rectal: defer   Medicare  Attestation I have personally reviewed: The patient's medical and social history Their use of alcohol, tobacco or illicit drugs Their current medications and supplements The patient's functional ability including ADLs,fall risks, home safety risks, cognitive, and hearing and visual impairment Diet and physical activities Evidence for depression or mood disorders  The patient's weight, height, BMI, and visual acuity have been recorded in the chart.  I have made referrals, counseling, and provided education to the patient based on review of the above and I have provided the patient with a written personalized care plan for preventive services.     Vicie Mutters, PA-C   01/15/2016

## 2016-01-15 NOTE — Patient Instructions (Addendum)
Use the inhaler once daily before you brush your teeth   [facility]  7 a.m.-6:30 p.m., Monday 7 a.m.-5 p.m., Tuesday-Friday Schedule an appointment by calling [phone].  Schedule with Dr. Delman Cheadle   Cologuard is an easy to use noninvasive colon cancer screening test based on the latest advances in stool DNA science.   Colon cancer is 3rd most diagnosed cancer and 2nd leading cause of death in both men and women 69 years of age and older despite being one of the most preventable and treatable cancers if found early.  4 of out 5 people diagnosed with colon cancer have NO prior family history.  When caught EARLY 90% of colon cancer is curable.   You have agreed to do a Cologuard screening and have declined a colonoscopy in spite of being explained the risks and benefits of the colonoscopy in detail, including cancer and death. Please understand that this is test not as sensitive or specific as a colonoscopy and you are still recommended to get a colonoscopy.   If you are NOT medicare please call your insurance company and given them this CPT code, [phone], in order to see how much your insurance company will cover Or you can call [phone] to talk with Cologuard about pricing and coverage.  Out-of-pocket cost for Cologuard can range from $0 - $649 so please call  You will receive a short call from [place] support center at [employer], when you receive a call they will say they are from [place],  to confirm your mailing address and give you more information.  When they calll you, it will appear on the caller ID as "[employer]" or in some cases only this number will appear, [phone].   [employer] will ship your collection kit directly to you. You will collect a single stool sample in the privacy of your own home, no special preparation required. You will return the kit via [facility] pre-paid shipping or pick-up,  in the same box it arrived in. Then I will contact you to discuss your results after I receive them from the laboratory.   If you have any questions or concerns, Cologuard Customer Support Specialist are available 24 hours a day, 7 days a week at [phone] or go to [email or web address].     Please take the prednisone to help decrease inflammation and therefore decrease symptoms. Take it it with food to avoid GI upset. It can cause increased energy but on the other hand it can make it hard to sleep at night so please take it AT [place], it takes 8-12 hours to start working so it will NOT affect your sleeping if you take it at night with your food!!  If you are diabetic it will increase your sugars so decrease carbs and monitor your sugars closely.    If you have a smart phone, please look up Smoke Free app, this will help you stay on track and give you information about money you have saved, life that you have gained back and a ton of more information.   We are giving you chantix for smoking cessation. You can do it! And we are here to help! You may have heard some scary side effects about chantix, the three most common I hear about are nausea, crazy dreams and depression.  However, I like for my patients to try to stay on 1/2 a tablet twice a day rather than one tablet twice  a day as normally prescribed. This helps decrease the chances of side effects and helps save money by making a one month prescription last two months  Please start the prescription this way:  Start 1/2 tablet by mouth once daily after food with a full glass of water for 3 days Then do 1/2 tablet by mouth twice daily for 4 days. During this first week you can smoke, but try to stop after this week.  At this point we have several options: 1) continue on 1/2 tablet twice a day- which I encourage you to do. You can stay on this dose the rest of the time on the medication or if you still feel the need to smoke you  can do one of the two options below. 2) do one tablet in the morning and 1/2 in the evening which helps decrease dreams. 3) do one tablet twice a day.   What if I miss a dose? If you miss a dose, take it as soon as you can. If it is almost time for your next dose, take only that dose. Do not take double or extra doses.  What should I watch for while using this medicine? Visit your doctor or health care professional for regular check ups. Ask for ongoing advice and encouragement from your doctor or healthcare professional, friends, and family to help you quit. If you smoke while on this medication, quit again  Your mouth may get dry. Chewing sugarless gum or hard candy, and drinking plenty of water may help. Contact your doctor if the problem does not go away or is severe.  You may get drowsy or dizzy. Do not drive, use machinery, or do anything that needs mental alertness until you know how this medicine affects you. Do not stand or sit up quickly, especially if you are an older patient.   The use of this medicine may increase the chance of suicidal thoughts or actions. Pay special attention to how you are responding while on this medicine. Any worsening of mood, or thoughts of suicide or dying should be reported to your health care professional right away.  ADVANTAGES OF QUITTING SMOKING  Within 20 minutes, blood pressure decreases. Your pulse is at normal level.  After 8 hours, carbon monoxide levels in the blood return to normal. Your oxygen level increases.  After 24 hours, the chance of having a heart attack starts to decrease. Your breath, hair, and body stop smelling like smoke.  After 48 hours, damaged nerve endings begin to recover. Your sense of taste and smell improve.  After 72 hours, the body is virtually free of nicotine. Your bronchial tubes relax and breathing becomes easier.  After 2 to 12 weeks, lungs can hold more air. Exercise becomes easier and circulation  improves.  After 1 year, the risk of coronary heart disease is cut in half.  After 5 years, the risk of stroke falls to the same as a nonsmoker.  After 10 years, the risk of lung cancer is cut in half and the risk of other cancers decreases significantly.  After 15 years, the risk of coronary heart disease drops, usually to the level of a nonsmoker.  You will have extra money to spend on things other than cigarettes.  Chronic Obstructive Pulmonary Disease Chronic obstructive pulmonary disease (COPD) is a common lung condition in which airflow from the lungs is limited. COPD is a general term that can be used to describe many different lung problems that limit airflow, including  both chronic bronchitis and emphysema. If you have COPD, your lung function will probably never return to normal, but there are measures you can take to improve lung function and make yourself feel better. CAUSES   Smoking (common).  Exposure to secondhand smoke.  Genetic problems.  Chronic inflammatory lung diseases or recurrent infections. SYMPTOMS  Shortness of breath, especially with physical activity.  Deep, persistent (chronic) cough with a large amount of thick mucus.  Wheezing.  Rapid breaths (tachypnea).  Gray or bluish discoloration (cyanosis) of the skin, especially in your fingers, toes, or lips.  Fatigue.  Weight loss.  Frequent infections or episodes when breathing symptoms become much worse (exacerbations).  Chest tightness. DIAGNOSIS Your health care provider will take a medical history and perform a physical examination to diagnose COPD. Additional tests for COPD may include:  Lung (pulmonary) function tests.  Chest X-ray.  CT scan.  Blood tests. TREATMENT  Treatment for COPD may include:  Inhaler and nebulizer medicines. These help manage the symptoms of COPD and make your breathing more comfortable.  Supplemental oxygen. Supplemental oxygen is only helpful if you have  a low oxygen level in your blood.  Exercise and physical activity. These are beneficial for nearly all people with COPD.  Lung surgery or transplant.  Nutrition therapy to gain weight, if you are underweight.  Pulmonary rehabilitation. This may involve working with a team of health care providers and specialists, such as respiratory, occupational, and physical therapists. HOME CARE INSTRUCTIONS  Take all medicines (inhaled or pills) as directed by your health care provider.  Avoid over-the-counter medicines or cough syrups that dry up your airway (such as antihistamines) and slow down the elimination of secretions unless instructed otherwise by your health care provider.  If you are a smoker, the most important thing that you can do is stop smoking. Continuing to smoke will cause further lung damage and breathing trouble. Ask your health care provider for help with quitting smoking. He or she can direct you to community resources or hospitals that provide support.  Avoid exposure to irritants such as smoke, chemicals, and fumes that aggravate your breathing.  Use oxygen therapy and pulmonary rehabilitation if directed by your health care provider. If you require home oxygen therapy, ask your health care provider whether you should purchase a pulse oximeter to measure your oxygen level at home.  Avoid contact with individuals who have a contagious illness.  Avoid extreme temperature and humidity changes.  Eat healthy foods. Eating smaller, more frequent meals and resting before meals may help you maintain your strength.  Stay active, but balance activity with periods of rest. Exercise and physical activity will help you maintain your ability to do things you want to do.  Preventing infection and hospitalization is very important when you have COPD. Make sure to receive all the vaccines your health care provider recommends, especially the pneumococcal and influenza vaccines. Ask your  health care provider whether you need a pneumonia vaccine.  Learn and use relaxation techniques to manage stress.  Learn and use controlled breathing techniques as directed by your health care provider. Controlled breathing techniques include:  Pursed lip breathing. Start by breathing in (inhaling) through your nose for 1 second. Then, purse your lips as if you were going to whistle and breathe out (exhale) through the pursed lips for 2 seconds.  Diaphragmatic breathing. Start by putting one hand on your abdomen just above your waist. Inhale slowly through your nose. The hand on your abdomen should  move out. Then purse your lips and exhale slowly. You should be able to feel the hand on your abdomen moving in as you exhale.  Learn and use controlled coughing to clear mucus from your lungs. Controlled coughing is a series of short, progressive coughs. The steps of controlled coughing are: 1. Lean your head slightly forward. 2. Breathe in deeply using diaphragmatic breathing. 3. Try to hold your breath for 3 seconds. 4. Keep your mouth slightly open while coughing twice. 5. Spit any mucus out into a tissue. 6. Rest and repeat the steps once or twice as needed. SEEK MEDICAL CARE IF:  You are coughing up more mucus than usual.  There is a change in the color or thickness of your mucus.  Your breathing is more labored than usual.  Your breathing is faster than usual. SEEK IMMEDIATE MEDICAL CARE IF:  You have shortness of breath while you are resting.  You have shortness of breath that prevents you from:  Being able to talk.  Performing your usual physical activities.  You have chest pain lasting longer than 5 minutes.  Your skin color is more cyanotic than usual.  You measure low oxygen saturations for longer than 5 minutes with a pulse oximeter. MAKE SURE YOU:  Understand these instructions.  Will watch your condition.  Will get help right away if you are not doing well or  get worse.   This information is not intended to replace advice given to you by your health care provider. Make sure you discuss any questions you have with your health care provider.   Document Released: 03/11/2005 Document Revised: 06/22/2014 Document Reviewed: 01/26/2013 Elsevier Interactive Patient Education [employer].

## 2016-01-16 ENCOUNTER — Other Ambulatory Visit: Payer: Self-pay | Admitting: Physician Assistant

## 2016-01-16 DIAGNOSIS — Z1231 Encounter for screening mammogram for malignant neoplasm of breast: Secondary | ICD-10-CM

## 2016-01-20 ENCOUNTER — Other Ambulatory Visit: Payer: Self-pay | Admitting: Physician Assistant

## 2016-01-20 ENCOUNTER — Telehealth: Payer: Self-pay | Admitting: Physician Assistant

## 2016-01-20 MED ORDER — NYSTATIN 100000 UNIT/ML MT SUSP: 5.0000 mL | Freq: Four times a day (QID) | OROMUCOSAL | 1 refills | Status: DC

## 2016-01-20 MED ORDER — NYSTATIN 100000 UNIT/ML MT SUSP: OROMUCOSAL | 1 refills | Status: DC

## 2016-01-20 NOTE — Telephone Encounter (Signed)
Patient was given breo inhaler, states that she has had left sided sore throat, "pimple" on her throat since using it.   Likely yeast due to breo. Stop breo for now, will send in nystatin mouth wash, use mouth wash for 48 hours after symptoms resolved. We can retry breo but HAVE to brush teeth/wash mouth afterwards with 1 capful of white vinegar and warm water, switch and spit.   Follow up in the office if not better.

## 2016-01-23 NOTE — Progress Notes (Signed)
[facility] for patient to sch DEXA and Mammogram

## 2016-01-23 NOTE — Progress Notes (Signed)
[facility] with patient

## 2016-02-03 ENCOUNTER — Encounter: Payer: Self-pay | Admitting: Physician Assistant

## 2016-02-21 LAB — COLOGUARD

## 2016-04-04 ENCOUNTER — Encounter: Payer: Self-pay | Admitting: *Deleted

## 2016-05-19 ENCOUNTER — Ambulatory Visit: Payer: Self-pay | Admitting: Internal Medicine

## 2016-06-16 ENCOUNTER — Other Ambulatory Visit: Payer: Self-pay | Admitting: *Deleted

## 2016-06-16 MED ORDER — FLUOXETINE HCL 40 MG PO CAPS: ORAL_CAPSULE | ORAL | 0 refills | Status: AC

## 2016-06-17 ENCOUNTER — Other Ambulatory Visit: Payer: Self-pay | Admitting: Nurse Practitioner

## 2016-06-24 ENCOUNTER — Ambulatory Visit: Payer: Self-pay | Admitting: Internal Medicine

## 2016-11-11 ENCOUNTER — Encounter: Payer: Self-pay | Admitting: Internal Medicine

## 2016-11-13 ENCOUNTER — Telehealth: Payer: Self-pay | Admitting: Oncology

## 2016-11-13 NOTE — Telephone Encounter (Signed)
Faxed records to [address]

## 2016-12-11 ENCOUNTER — Ambulatory Visit: Payer: Self-pay | Admitting: Internal Medicine

## 2016-12-11 NOTE — Patient Instructions (Signed)

## 2016-12-11 NOTE — Progress Notes (Signed)
NO SHOW

## 2017-04-16 IMAGING — CR DG WRIST COMPLETE 3+V*L*
4 series · 4 of 4 positions shown · non-contrast
Comparison: 06/16/2006

CLINICAL DATA: Left wrist pain, injury 2 weeks ago

EXAM:
LEFT WRIST - COMPLETE 3+ VIEW

[wrist pa]
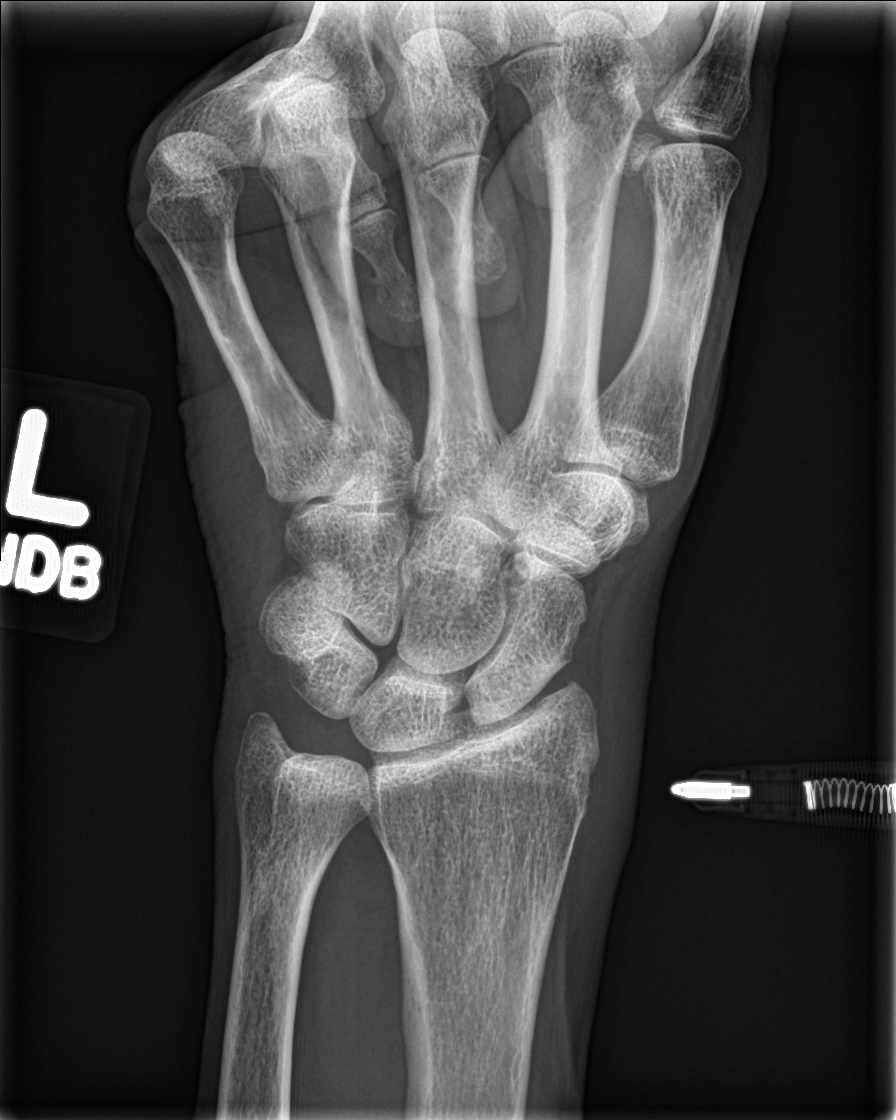

[wrist obl]
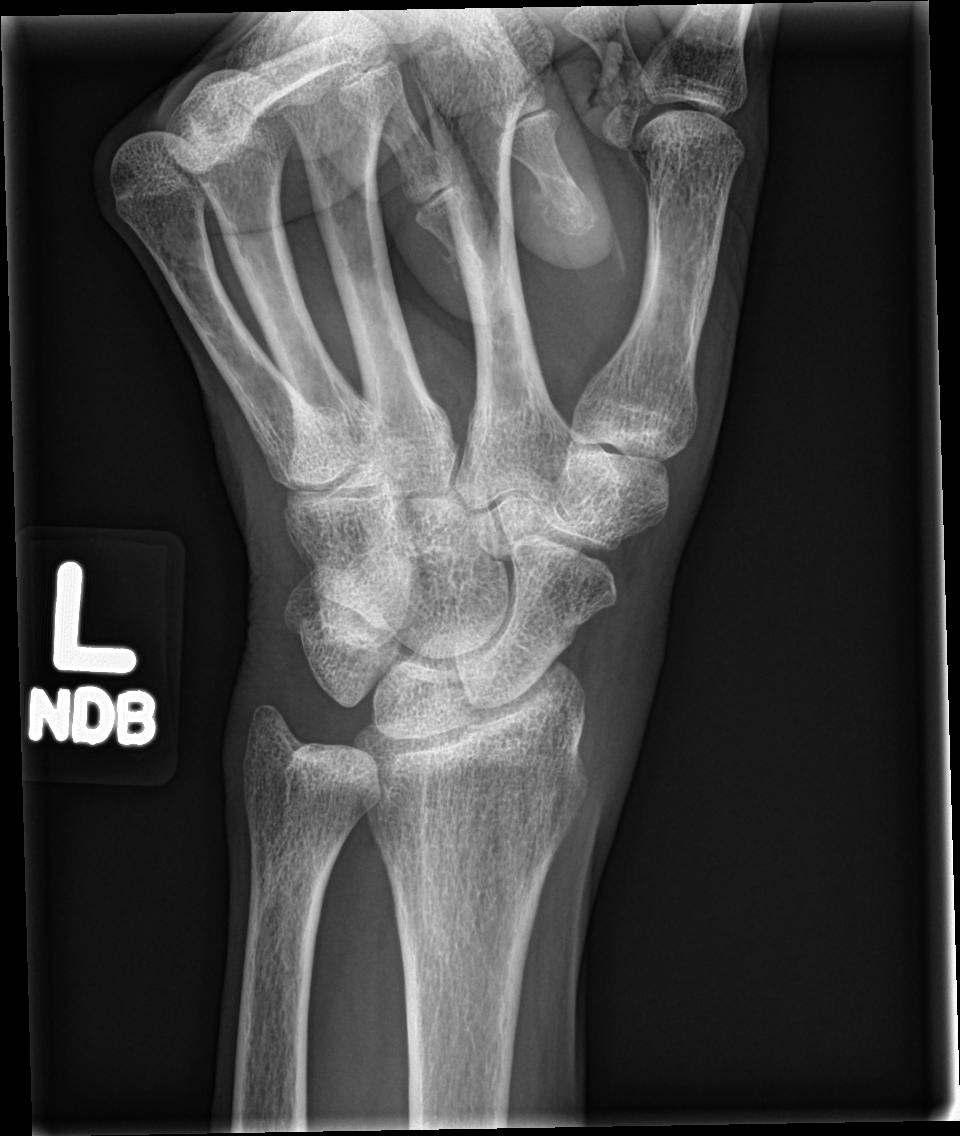

[wrist lat]
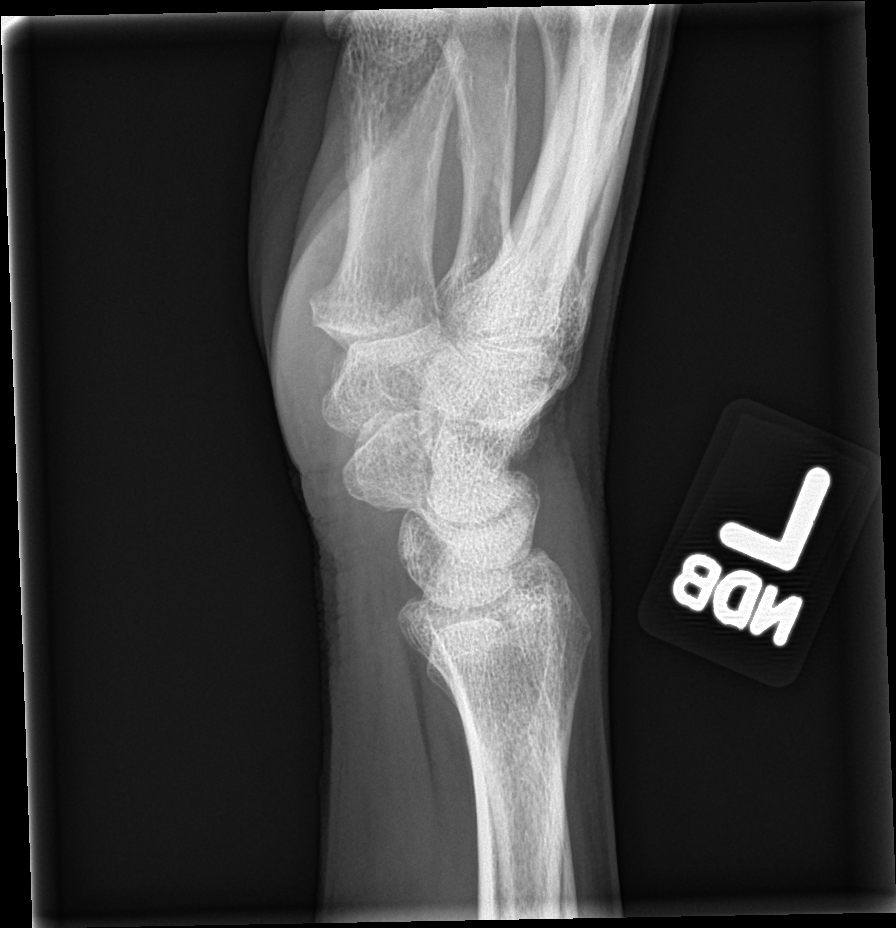

[wrist navicular]
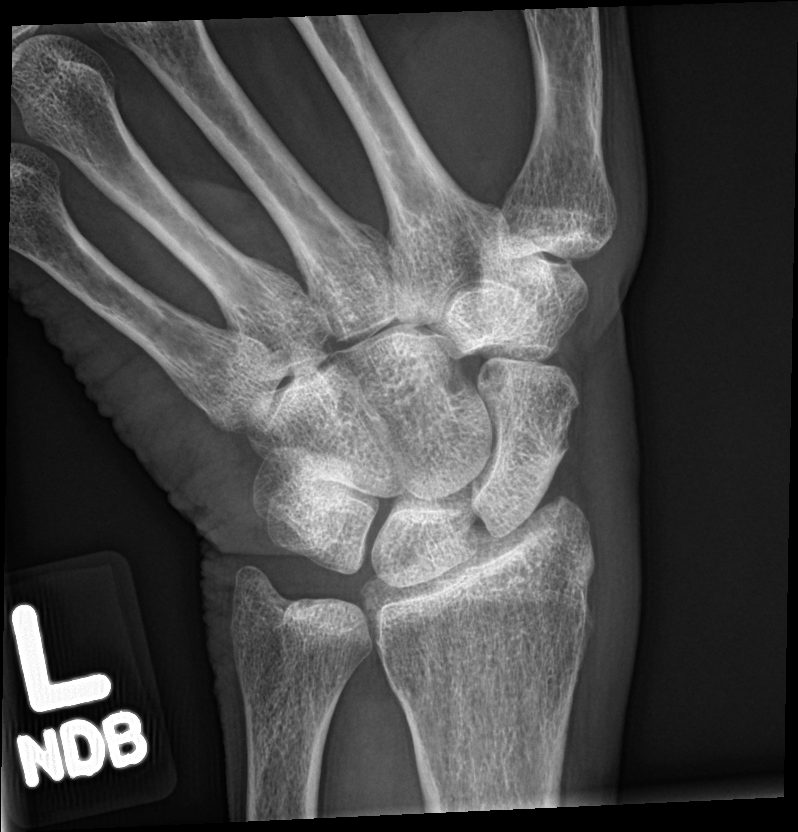

[4 of 4 positions shown; findings below may reference images not displayed]

FINDINGS: Four views of the left wrist submitted. No acute fracture or
subluxation. No radiopaque foreign body. Mild narrowing of
radiocarpal joint space.
IMPRESSION: No acute fracture or subluxation. Mild narrowing of radiocarpal
joint space.

## 2018-01-18 ENCOUNTER — Encounter: Payer: Self-pay | Admitting: Internal Medicine
